# Patient Record
Sex: Female | Born: 1990 | Hispanic: No | Marital: Single | Smoking: Former smoker
Health system: Southern US, Community
[De-identification: ages and names within clinical notes are randomized; demographics above are authoritative.]

## PROBLEM LIST (undated history)

## (undated) ENCOUNTER — Inpatient Hospital Stay (HOSPITAL_COMMUNITY): Payer: Self-pay

## (undated) DIAGNOSIS — F329 Major depressive disorder, single episode, unspecified: Secondary | ICD-10-CM

## (undated) DIAGNOSIS — F909 Attention-deficit hyperactivity disorder, unspecified type: Secondary | ICD-10-CM

## (undated) DIAGNOSIS — O24419 Gestational diabetes mellitus in pregnancy, unspecified control: Secondary | ICD-10-CM

## (undated) DIAGNOSIS — F913 Oppositional defiant disorder: Secondary | ICD-10-CM

## (undated) DIAGNOSIS — F99 Mental disorder, not otherwise specified: Secondary | ICD-10-CM

## (undated) DIAGNOSIS — F419 Anxiety disorder, unspecified: Secondary | ICD-10-CM

## (undated) DIAGNOSIS — Z98891 History of uterine scar from previous surgery: Secondary | ICD-10-CM

## (undated) HISTORY — PX: MOUTH SURGERY: SHX715

---
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
## 2011-02-14 LAB — DIFFERENTIAL
Basophils Absolute: 0 10*3/uL (ref 0.0–0.1)
Basophils Relative: 0 % (ref 0–1)
Eosinophils Absolute: 0.2 10*3/uL (ref 0.0–1.2)
Eosinophils Relative: 2 % (ref 0–5)
Lymphocytes Relative: 37 % (ref 24–48)
Lymphs Abs: 3.3 10*3/uL (ref 1.1–4.8)
Monocytes Absolute: 0.6 10*3/uL (ref 0.2–1.2)
Monocytes Relative: 7 % (ref 3–11)
Neutro Abs: 4.7 10*3/uL (ref 1.7–8.0)
Neutrophils Relative %: 54 % (ref 43–71)

## 2011-02-14 LAB — COMPREHENSIVE METABOLIC PANEL
ALT: 10 U/L (ref 0–35)
AST: 14 U/L (ref 0–37)
Albumin: 3.2 g/dL — ABNORMAL LOW (ref 3.5–5.2)
Alkaline Phosphatase: 50 U/L (ref 47–119)
BUN: 10 mg/dL (ref 6–23)
CO2: 24 mEq/L (ref 19–32)
Calcium: 9 mg/dL (ref 8.4–10.5)
Chloride: 107 mEq/L (ref 96–112)
Creatinine, Ser: 0.83 mg/dL (ref 0.4–1.2)
Glucose, Bld: 121 mg/dL — ABNORMAL HIGH (ref 70–99)
Potassium: 3.4 mEq/L — ABNORMAL LOW (ref 3.5–5.1)
Sodium: 140 mEq/L (ref 135–145)
Total Bilirubin: 0.5 mg/dL (ref 0.3–1.2)
Total Protein: 6.2 g/dL (ref 6.0–8.3)

## 2011-02-14 LAB — RAPID URINE DRUG SCREEN, HOSP PERFORMED
Amphetamines: NOT DETECTED
Barbiturates: NOT DETECTED
Benzodiazepines: NOT DETECTED
Cocaine: NOT DETECTED
Opiates: NOT DETECTED
Tetrahydrocannabinol: NOT DETECTED

## 2011-02-14 LAB — ACETAMINOPHEN LEVEL: Acetaminophen (Tylenol), Serum: <10 ug/mL — ABNORMAL LOW (ref 10–30)

## 2011-02-14 LAB — CBC
HCT: 37.3 % (ref 36.0–49.0)
Hemoglobin: 13 g/dL (ref 12.0–16.0)
MCHC: 34.8 g/dL (ref 31.0–37.0)
MCV: 86.4 fL (ref 78.0–98.0)
Platelets: 253 10*3/uL (ref 150–400)
RBC: 4.32 MIL/uL (ref 3.80–5.70)
RDW: 12.9 % (ref 11.4–15.5)
WBC: 8.8 10*3/uL (ref 4.5–13.5)

## 2011-02-14 LAB — SALICYLATE LEVEL: Salicylate Lvl: <4 mg/dL (ref 2.8–20.0)

## 2011-02-14 LAB — PREGNANCY, URINE: Preg Test, Ur: NEGATIVE

## 2011-07-09 ENCOUNTER — Encounter (HOSPITAL_COMMUNITY): Payer: Self-pay | Admitting: *Deleted

## 2011-07-09 ENCOUNTER — Inpatient Hospital Stay (HOSPITAL_COMMUNITY)
Admission: AD | Admit: 2011-07-09 | Discharge: 2011-07-09 | Disposition: A | Discharge status: Home / Self Care | Payer: Self-pay | Source: Ambulatory Visit | Attending: Obstetrics & Gynecology | Admitting: Obstetrics & Gynecology

## 2011-07-09 DIAGNOSIS — O200 Threatened abortion: Secondary | ICD-10-CM | POA: Insufficient documentation

## 2011-07-09 LAB — URINALYSIS, ROUTINE W REFLEX MICROSCOPIC
Glucose, UA: NEGATIVE mg/dL
Protein, ur: NEGATIVE mg/dL

## 2011-07-09 LAB — URINE MICROSCOPIC-ADD ON

## 2011-07-09 LAB — POCT PREGNANCY, URINE: Preg Test, Ur: POSITIVE

## 2011-07-09 NOTE — ED Provider Notes (Signed)
History     Chief Complaint  Patient presents with  . Possible Pregnancy   HPI Patient seen for intermittent suprapubic cramping x 1 week.  Had minimal brownish spotting this morning.  No radiation of cramping.  Has some mild nausea, no vomiting.  No fevers, chills, dysuria, vaginal discharge, frequency.  OB History    Grav Para Term Preterm Abortions TAB SAB Ect Mult Living   1               Past Medical History  Diagnosis Date  . No pertinent past medical history     Past Surgical History  Procedure Date  . No past surgeries     No family history on file.  History  Substance Use Topics  . Smoking status: Former Games developer  . Smokeless tobacco: Not on file  . Alcohol Use: No    Allergies: Allergies not on file  No prescriptions prior to admission    Review of Systems  All other systems reviewed and are negative.   Physical Exam   Blood pressure 133/88, pulse 80, temperature 98.8 F (37.1 C), temperature source Oral, resp. rate 18, height 5\' 2"  (1.575 m), weight 76.204 kg (168 lb), last menstrual period 05/01/2011, SpO2 98.00%.  Physical Exam  Constitutional: She appears well-developed and well-nourished.  HENT:  Head: Normocephalic and atraumatic.  Eyes: EOM are normal. Pupils are equal, round, and reactive to light.  Neck: Normal range of motion.  Cardiovascular: Normal rate and regular rhythm.   Respiratory: Effort normal and breath sounds normal.  GI: Soft. Bowel sounds are normal. She exhibits no distension and no mass. There is no tenderness. There is no rebound and no guarding.    Bedside US for fetal heart rate showed IUP at HR at 160.  MAU Course  Procedures Bedside US for FHR.  Assessment and Plan  1.  Threatened abortion. Discussed with patient possibility of miscarriage.  Information given to patient.  Patient counseled to establish care or to return if cramping continues, spotting or vaginal bleeding increases.  Saima Monterroso  JEHIEL 07/09/2011, 2:36 PM

## 2011-07-09 NOTE — Progress Notes (Signed)
Pt states she had a POS HPT last week, Has had cramping on and off and a little dark spotting. Nausea, no vomiting.

## 2011-07-22 ENCOUNTER — Inpatient Hospital Stay (HOSPITAL_COMMUNITY)
Admission: AD | Admit: 2011-07-22 | Discharge: 2011-07-22 | Disposition: A | Discharge status: Home / Self Care | Payer: Medicaid Other | Source: Ambulatory Visit | Attending: Obstetrics and Gynecology | Admitting: Obstetrics and Gynecology

## 2011-07-22 ENCOUNTER — Encounter (HOSPITAL_COMMUNITY): Payer: Self-pay

## 2011-07-22 DIAGNOSIS — Z3400 Encounter for supervision of normal first pregnancy, unspecified trimester: Secondary | ICD-10-CM

## 2011-07-22 DIAGNOSIS — O99891 Other specified diseases and conditions complicating pregnancy: Secondary | ICD-10-CM | POA: Insufficient documentation

## 2011-07-22 HISTORY — DX: Attention-deficit hyperactivity disorder, unspecified type: F90.9

## 2011-07-22 HISTORY — DX: Mental disorder, not otherwise specified: F99
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
## 2011-07-22 NOTE — Progress Notes (Signed)
Pt was taking Risperdal and stopped taking when she found out she was pregnant.

## 2011-07-27 LAB — ABO/RH: RH Type: POSITIVE

## 2011-07-27 LAB — HEPATITIS B SURFACE ANTIGEN: Hepatitis B Surface Ag: NEGATIVE

## 2011-07-27 LAB — RUBELLA ANTIBODY, IGM: Rubella: IMMUNE

## 2011-07-27 LAB — RPR: RPR: NONREACTIVE

## 2011-11-10 ENCOUNTER — Encounter (HOSPITAL_COMMUNITY): Payer: Self-pay | Admitting: *Deleted

## 2011-11-10 ENCOUNTER — Inpatient Hospital Stay (HOSPITAL_COMMUNITY)
Admission: AD | Admit: 2011-11-10 | Discharge: 2011-11-10 | Disposition: A | Discharge status: Home / Self Care | Payer: Medicaid Other | Source: Ambulatory Visit | Attending: Obstetrics and Gynecology | Admitting: Obstetrics and Gynecology

## 2011-11-10 DIAGNOSIS — O212 Late vomiting of pregnancy: Secondary | ICD-10-CM | POA: Insufficient documentation

## 2011-11-10 DIAGNOSIS — Z3480 Encounter for supervision of other normal pregnancy, unspecified trimester: Secondary | ICD-10-CM

## 2011-11-10 DIAGNOSIS — R197 Diarrhea, unspecified: Secondary | ICD-10-CM | POA: Insufficient documentation

## 2011-11-10 DIAGNOSIS — R112 Nausea with vomiting, unspecified: Secondary | ICD-10-CM

## 2011-11-10 LAB — URINALYSIS, ROUTINE W REFLEX MICROSCOPIC
Hgb urine dipstick: NEGATIVE
Ketones, ur: 40 mg/dL — AB
Protein, ur: 30 mg/dL — AB
Urobilinogen, UA: 0.2 mg/dL (ref 0.0–1.0)

## 2011-11-10 LAB — URINE MICROSCOPIC-ADD ON

## 2011-11-10 MED ORDER — LOPERAMIDE HCL 2 MG PO CAPS
4.0000 mg | ORAL_CAPSULE | ORAL | Status: DC | PRN
  Administered 2011-11-10: 4 mg via ORAL
  Filled 2011-11-10: qty 2

## 2011-11-10 MED ORDER — NITROFURANTOIN MONOHYD MACRO 100 MG PO CAPS: 100.0000 mg | ORAL_CAPSULE | Freq: Two times a day (BID) | ORAL | Status: AC

## 2011-11-10 MED ORDER — ONDANSETRON 8 MG/NS 50 ML IVPB
8.0000 mg | Freq: Once | INTRAVENOUS | Status: AC
  Administered 2011-11-10: 8 mg via INTRAVENOUS
  Filled 2011-11-10: qty 8

## 2011-11-10 MED ORDER — LACTATED RINGERS IV BOLUS (SEPSIS)
1000.0000 mL | Freq: Once | INTRAVENOUS | Status: AC
  Administered 2011-11-10: 1000 mL via INTRAVENOUS

## 2011-11-10 MED ORDER — ONDANSETRON 4 MG PO TBDP
4.0000 mg | ORAL_TABLET | Freq: Once | ORAL | Status: AC
  Administered 2011-11-10: 4 mg via ORAL
  Filled 2011-11-10: qty 1

## 2011-11-10 MED ORDER — ONDANSETRON 4 MG PO TBDP: 4.0000 mg | ORAL_TABLET | Freq: Once | ORAL | Status: AC

## 2011-11-10 NOTE — Progress Notes (Signed)
Pt reports  Waking up throwing up many times with watery diarreaha. tp reports eating at [employer] last night about 3 am. Boy friend states he feels a little nausead as well but has not thrown up

## 2011-11-10 NOTE — ED Provider Notes (Signed)
Pt feels better since IV zofran and IV fluids.  She denies any fever or dysuria.  U/A + nitrites.  - CVAT.  Will d/c pt home with rx for zofran and macrobid and instructions to seek care if she develops a fever or back pain.  FHR 140's, reactive without decels or contractions

## 2011-11-10 NOTE — ED Provider Notes (Signed)
History     Chief Complaint  Patient presents with  . Emesis  . Diarrhea   HPITabitha Silva is 21 y.o. G1P0 [redacted]w[redacted]d weeks presenting with nausea, vomiting and diarrhea that began this am.  Sudden onset.  Denies fever.  Patient of Dr. Berenda Morale.  Denies vaginal bleeding, contraction.  Reports fetal movement today.      Past Medical History  Diagnosis Date  . Mental disorder   . ADHD (attention deficit hyperactivity disorder)     Past Surgical History  Procedure Date  . No past surgeries     No family history on file.  History  Substance Use Topics  . Smoking status: Current Everyday Smoker  . Smokeless tobacco: Never Used  . Alcohol Use: No    Allergies:  Allergies  Allergen Reactions  . Sulfa Antibiotics Hives    Prescriptions prior to admission  Medication Sig Dispense Refill  . flintstones complete (FLINTSTONES) 60 MG chewable tablet Chew 1 tablet by mouth daily.          Review of Systems  Constitutional: Negative for fever and chills.  Respiratory: Positive for shortness of breath.   Gastrointestinal: Positive for nausea, vomiting and diarrhea. Negative for abdominal pain.  Genitourinary:       Neg for vaginal bleeding.   Physical Exam   Blood pressure 131/78, pulse 119, temperature 98.4 F (36.9 C), temperature source Oral, resp. rate 18, height 5\' 3"  (1.6 m), weight 180 lb 6.4 oz (81.829 kg), last menstrual period 05/01/2011.  Physical Exam  Constitutional: She is oriented to person, place, and time. She appears well-developed and well-nourished.  HENT:  Head: Normocephalic.  Neck: Normal range of motion.  Respiratory: Effort normal and breath sounds normal. No respiratory distress. She has no wheezes. She has no rales. She exhibits no tenderness.  GI: There is no tenderness. There is no rebound and no guarding.  Neurological: She is alert and oriented to person, place, and time.  Skin: Skin is warm and dry.    MAU Course  Procedures  MDM   Care turned over to Olivia Silva, CNM  Assessment and Plan    KEY,EVE M 11/10/2011, 11:06 AM   Matt Holmes, NP 11/10/11 1158

## 2011-11-10 NOTE — Progress Notes (Signed)
Reviewed labs with Philipp Deputy, CNM.  Plans to put in orders for medications and IV fluids

## 2011-11-11 NOTE — Progress Notes (Signed)
FHT from 1-5 reviewed.  Reactive NST, especially for 27 weeks, no significant ctx.

## 2012-02-02 ENCOUNTER — Inpatient Hospital Stay (HOSPITAL_COMMUNITY)
Admission: AD | Admit: 2012-02-02 | Discharge: 2012-02-03 | Disposition: A | Discharge status: Home / Self Care | Payer: Medicaid Other | Source: Ambulatory Visit | Attending: Obstetrics and Gynecology | Admitting: Obstetrics and Gynecology

## 2012-02-02 ENCOUNTER — Encounter (HOSPITAL_COMMUNITY): Payer: Self-pay | Admitting: Obstetrics and Gynecology

## 2012-02-02 DIAGNOSIS — O479 False labor, unspecified: Secondary | ICD-10-CM | POA: Insufficient documentation

## 2012-02-02 HISTORY — DX: Oppositional defiant disorder: F91.3

## 2012-02-02 HISTORY — DX: Major depressive disorder, single episode, unspecified: F32.9

## 2012-02-02 NOTE — MAU Note (Signed)
"  I started having UC's about 2000 tonight.  I leaked some water 2 days after finishing a shower and continued to leak out water.  I was seen in the doctor's office on Thursday and they did not do a pH test.  I am still wetting my underwear several times a day.  (+) FM, just not that often."

## 2012-02-02 NOTE — Progress Notes (Signed)
"  In 2008, I attempted to commit suicide and was hospitalized for it.  Since then, I have been diagnosed and put on the correct medications for it."

## 2012-02-03 LAB — AMNISURE RUPTURE OF MEMBRANE (ROM) NOT AT ARMC: Amnisure ROM: NEGATIVE

## 2012-02-03 NOTE — Discharge Instructions (Signed)
Keep your scheduled appointment with [facility] this week.  Please call your doctor's office for any further questions or concerns.

## 2012-02-04 ENCOUNTER — Encounter (HOSPITAL_COMMUNITY)
Admission: AD | Disposition: A | Discharge status: Home / Self Care | Payer: Self-pay | Source: Home / Self Care | Attending: Obstetrics and Gynecology

## 2012-02-04 ENCOUNTER — Inpatient Hospital Stay (HOSPITAL_COMMUNITY): Payer: Medicaid Other | Admitting: Anesthesiology

## 2012-02-04 ENCOUNTER — Encounter (HOSPITAL_COMMUNITY): Payer: Self-pay | Admitting: *Deleted

## 2012-02-04 ENCOUNTER — Encounter (HOSPITAL_COMMUNITY): Payer: Self-pay | Admitting: Anesthesiology

## 2012-02-04 ENCOUNTER — Inpatient Hospital Stay (HOSPITAL_COMMUNITY)
Admission: AD | Admit: 2012-02-04 | Discharge: 2012-02-06 | DRG: 766 | Disposition: A | Discharge status: Home / Self Care | Payer: Medicaid Other | Attending: Obstetrics and Gynecology | Admitting: Obstetrics and Gynecology

## 2012-02-04 ENCOUNTER — Other Ambulatory Visit: Payer: Self-pay

## 2012-02-04 DIAGNOSIS — O99892 Other specified diseases and conditions complicating childbirth: Secondary | ICD-10-CM | POA: Diagnosis present

## 2012-02-04 DIAGNOSIS — Z22330 Carrier of Group B streptococcus: Secondary | ICD-10-CM

## 2012-02-04 DIAGNOSIS — O4290 Premature rupture of membranes, unspecified as to length of time between rupture and onset of labor, unspecified weeks of gestation: Principal | ICD-10-CM | POA: Diagnosis present

## 2012-02-04 DIAGNOSIS — Z98891 History of uterine scar from previous surgery: Secondary | ICD-10-CM

## 2012-02-04 HISTORY — DX: History of uterine scar from previous surgery: Z98.891

## 2012-02-04 LAB — CBC
HCT: 33.7 % — ABNORMAL LOW (ref 36.0–46.0)
MCHC: 32.9 g/dL (ref 30.0–36.0)
MCV: 90.3 fL (ref 78.0–100.0)
Platelets: 186 10*3/uL (ref 150–400)
RDW: 14 % (ref 11.5–15.5)

## 2012-02-04 SURGERY — Surgical Case
Anesthesia: Epidural | Site: Abdomen | Wound class: Clean Contaminated

## 2012-02-04 MED ORDER — LACTATED RINGERS IV SOLN
INTRAVENOUS | Status: DC | PRN
  Administered 2012-02-04 (×2): via INTRAVENOUS

## 2012-02-04 MED ORDER — MENTHOL 3 MG MT LOZG: 1.0000 | LOZENGE | OROMUCOSAL | Status: DC | PRN

## 2012-02-04 MED ORDER — SCOPOLAMINE 1 MG/3DAYS TD PT72
1.0000 | MEDICATED_PATCH | Freq: Once | TRANSDERMAL | Status: DC
  Administered 2012-02-04: 1.5 mg via TRANSDERMAL

## 2012-02-04 MED ORDER — PENICILLIN G POTASSIUM 5000000 UNITS IJ SOLR
5.0000 10*6.[IU] | Freq: Once | INTRAVENOUS | Status: AC
  Administered 2012-02-04: 5 10*6.[IU] via INTRAVENOUS
  Filled 2012-02-04: qty 5

## 2012-02-04 MED ORDER — MEPERIDINE HCL 25 MG/ML IJ SOLN: 6.2500 mg | INTRAMUSCULAR | Status: DC | PRN

## 2012-02-04 MED ORDER — PRENATAL MULTIVITAMIN CH
1.0000 | ORAL_TABLET | Freq: Every day | ORAL | Status: DC
  Administered 2012-02-05 – 2012-02-06 (×2): 1 via ORAL
  Filled 2012-02-04 (×2): qty 1

## 2012-02-04 MED ORDER — TETANUS-DIPHTH-ACELL PERTUSSIS 5-2.5-18.5 LF-MCG/0.5 IM SUSP: 0.5000 mL | Freq: Once | INTRAMUSCULAR | Status: DC

## 2012-02-04 MED ORDER — FENTANYL CITRATE 0.05 MG/ML IJ SOLN
INTRAMUSCULAR | Status: AC
  Filled 2012-02-04: qty 2

## 2012-02-04 MED ORDER — WITCH HAZEL-GLYCERIN EX PADS: 1.0000 "application " | MEDICATED_PAD | CUTANEOUS | Status: DC | PRN

## 2012-02-04 MED ORDER — FLEET ENEMA 7-19 GM/118ML RE ENEM: 1.0000 | ENEMA | RECTAL | Status: DC | PRN

## 2012-02-04 MED ORDER — PHENYLEPHRINE 40 MCG/ML (10ML) SYRINGE FOR IV PUSH (FOR BLOOD PRESSURE SUPPORT)
80.0000 ug | PREFILLED_SYRINGE | INTRAVENOUS | Status: DC | PRN
  Filled 2012-02-04: qty 5

## 2012-02-04 MED ORDER — 0.9 % SODIUM CHLORIDE (POUR BTL) OPTIME
TOPICAL | Status: DC | PRN
  Administered 2012-02-04: 1000 mL

## 2012-02-04 MED ORDER — OXYTOCIN 10 UNIT/ML IJ SOLN
INTRAMUSCULAR | Status: DC | PRN
  Administered 2012-02-04: 20 [IU]

## 2012-02-04 MED ORDER — BUPIVACAINE HCL (PF) 0.25 % IJ SOLN: INTRAMUSCULAR | Status: DC | PRN

## 2012-02-04 MED ORDER — ZOLPIDEM TARTRATE 5 MG PO TABS: 5.0000 mg | ORAL_TABLET | Freq: Every evening | ORAL | Status: DC | PRN

## 2012-02-04 MED ORDER — MORPHINE SULFATE (PF) 0.5 MG/ML IJ SOLN
INTRAMUSCULAR | Status: DC | PRN
  Administered 2012-02-04: 1 mg via INTRAVENOUS
  Administered 2012-02-04: 4 mg via EPIDURAL

## 2012-02-04 MED ORDER — SIMETHICONE 80 MG PO CHEW
80.0000 mg | CHEWABLE_TABLET | Freq: Three times a day (TID) | ORAL | Status: DC
  Administered 2012-02-05 – 2012-02-06 (×5): 80 mg via ORAL

## 2012-02-04 MED ORDER — TERBUTALINE SULFATE 1 MG/ML IJ SOLN: 0.2500 mg | Freq: Once | INTRAMUSCULAR | Status: DC | PRN

## 2012-02-04 MED ORDER — LIDOCAINE HCL (PF) 1 % IJ SOLN: 30.0000 mL | INTRAMUSCULAR | Status: DC | PRN

## 2012-02-04 MED ORDER — LACTATED RINGERS IV SOLN
500.0000 mL | INTRAVENOUS | Status: DC | PRN
  Administered 2012-02-04: 1000 mL via INTRAVENOUS

## 2012-02-04 MED ORDER — LACTATED RINGERS IV SOLN
INTRAVENOUS | Status: DC
  Administered 2012-02-05 (×2): via INTRAVENOUS

## 2012-02-04 MED ORDER — OXYCODONE-ACETAMINOPHEN 5-325 MG PO TABS
1.0000 | ORAL_TABLET | ORAL | Status: DC | PRN
  Administered 2012-02-05: 2 via ORAL
  Administered 2012-02-05 (×2): 1 via ORAL
  Administered 2012-02-05 – 2012-02-06 (×2): 2 via ORAL
  Filled 2012-02-04 (×2): qty 2
  Filled 2012-02-04: qty 1
  Filled 2012-02-04: qty 2
  Filled 2012-02-04: qty 1

## 2012-02-04 MED ORDER — LACTATED RINGERS IV SOLN
INTRAVENOUS | Status: DC
  Administered 2012-02-04 (×3): via INTRAVENOUS

## 2012-02-04 MED ORDER — SCOPOLAMINE 1 MG/3DAYS TD PT72
MEDICATED_PATCH | TRANSDERMAL | Status: AC
  Filled 2012-02-04: qty 1

## 2012-02-04 MED ORDER — LANOLIN HYDROUS EX OINT: 1.0000 "application " | TOPICAL_OINTMENT | CUTANEOUS | Status: DC | PRN

## 2012-02-04 MED ORDER — ONDANSETRON HCL 4 MG/2ML IJ SOLN
INTRAMUSCULAR | Status: DC | PRN
  Administered 2012-02-04: 4 mg via INTRAVENOUS

## 2012-02-04 MED ORDER — ONDANSETRON HCL 4 MG/2ML IJ SOLN: 4.0000 mg | INTRAMUSCULAR | Status: DC | PRN

## 2012-02-04 MED ORDER — PHENYLEPHRINE 40 MCG/ML (10ML) SYRINGE FOR IV PUSH (FOR BLOOD PRESSURE SUPPORT): 80.0000 ug | PREFILLED_SYRINGE | INTRAVENOUS | Status: DC | PRN

## 2012-02-04 MED ORDER — IBUPROFEN 600 MG PO TABS: 600.0000 mg | ORAL_TABLET | Freq: Four times a day (QID) | ORAL | Status: DC | PRN

## 2012-02-04 MED ORDER — KETOROLAC TROMETHAMINE 30 MG/ML IJ SOLN: 30.0000 mg | Freq: Four times a day (QID) | INTRAMUSCULAR | Status: AC | PRN

## 2012-02-04 MED ORDER — CEFAZOLIN SODIUM-DEXTROSE 2-3 GM-% IV SOLR
2.0000 g | Freq: Three times a day (TID) | INTRAVENOUS | Status: DC
  Administered 2012-02-04: 2 g via INTRAVENOUS
  Filled 2012-02-04 (×2): qty 50

## 2012-02-04 MED ORDER — KETOROLAC TROMETHAMINE 60 MG/2ML IM SOLN
INTRAMUSCULAR | Status: AC
  Filled 2012-02-04: qty 2

## 2012-02-04 MED ORDER — SODIUM BICARBONATE 8.4 % IV SOLN
INTRAVENOUS | Status: DC | PRN
  Administered 2012-02-04: 4 mL via EPIDURAL

## 2012-02-04 MED ORDER — NALBUPHINE HCL 10 MG/ML IJ SOLN
5.0000 mg | INTRAMUSCULAR | Status: DC | PRN
  Filled 2012-02-04: qty 1

## 2012-02-04 MED ORDER — DIPHENHYDRAMINE HCL 25 MG PO CAPS: 25.0000 mg | ORAL_CAPSULE | ORAL | Status: DC | PRN

## 2012-02-04 MED ORDER — OXYTOCIN 20 UNITS IN LACTATED RINGERS INFUSION - SIMPLE
INTRAVENOUS | Status: AC
  Filled 2012-02-04: qty 1000

## 2012-02-04 MED ORDER — EPHEDRINE 5 MG/ML INJ
10.0000 mg | INTRAVENOUS | Status: DC | PRN
  Filled 2012-02-04: qty 4

## 2012-02-04 MED ORDER — DIPHENHYDRAMINE HCL 50 MG/ML IJ SOLN: 12.5000 mg | INTRAMUSCULAR | Status: DC | PRN

## 2012-02-04 MED ORDER — ACETAMINOPHEN 325 MG PO TABS: 650.0000 mg | ORAL_TABLET | ORAL | Status: DC | PRN

## 2012-02-04 MED ORDER — OXYTOCIN 20 UNITS IN LACTATED RINGERS INFUSION - SIMPLE: 125.0000 mL/h | Freq: Once | INTRAVENOUS | Status: DC

## 2012-02-04 MED ORDER — ONDANSETRON HCL 4 MG/2ML IJ SOLN: 4.0000 mg | Freq: Three times a day (TID) | INTRAMUSCULAR | Status: DC | PRN

## 2012-02-04 MED ORDER — EPHEDRINE 5 MG/ML INJ: 10.0000 mg | INTRAVENOUS | Status: DC | PRN

## 2012-02-04 MED ORDER — SIMETHICONE 80 MG PO CHEW: 80.0000 mg | CHEWABLE_TABLET | ORAL | Status: DC | PRN

## 2012-02-04 MED ORDER — DIPHENHYDRAMINE HCL 50 MG/ML IJ SOLN: 25.0000 mg | INTRAMUSCULAR | Status: DC | PRN

## 2012-02-04 MED ORDER — HYDROMORPHONE HCL PF 1 MG/ML IJ SOLN
0.2500 mg | INTRAMUSCULAR | Status: DC | PRN
  Administered 2012-02-04: 0.5 mg via INTRAVENOUS
  Administered 2012-02-04 (×2): 0.25 mg via INTRAVENOUS

## 2012-02-04 MED ORDER — OXYTOCIN 20 UNITS IN LACTATED RINGERS INFUSION - SIMPLE
1.0000 m[IU]/min | INTRAVENOUS | Status: DC
  Administered 2012-02-04: 1 m[IU]/min via INTRAVENOUS
  Filled 2012-02-04: qty 1000

## 2012-02-04 MED ORDER — LACTATED RINGERS IV SOLN
500.0000 mL | Freq: Once | INTRAVENOUS | Status: AC
  Administered 2012-02-04: 09:00:00 via INTRAVENOUS

## 2012-02-04 MED ORDER — MORPHINE SULFATE 0.5 MG/ML IJ SOLN
INTRAMUSCULAR | Status: AC
  Filled 2012-02-04: qty 10

## 2012-02-04 MED ORDER — CITRIC ACID-SODIUM CITRATE 334-500 MG/5ML PO SOLN
30.0000 mL | ORAL | Status: DC | PRN
  Administered 2012-02-04: 30 mL via ORAL
  Filled 2012-02-04: qty 15

## 2012-02-04 MED ORDER — LIDOCAINE-EPINEPHRINE 2 %-1:100000 IJ SOLN: INTRAMUSCULAR | Status: DC | PRN

## 2012-02-04 MED ORDER — SODIUM BICARBONATE 8.4 % IV SOLN
INTRAVENOUS | Status: AC
  Filled 2012-02-04: qty 50

## 2012-02-04 MED ORDER — IBUPROFEN 600 MG PO TABS
600.0000 mg | ORAL_TABLET | Freq: Four times a day (QID) | ORAL | Status: DC
  Administered 2012-02-05 – 2012-02-06 (×4): 600 mg via ORAL
  Filled 2012-02-04 (×4): qty 1

## 2012-02-04 MED ORDER — KETOROLAC TROMETHAMINE 30 MG/ML IJ SOLN
30.0000 mg | Freq: Four times a day (QID) | INTRAMUSCULAR | Status: AC | PRN
  Administered 2012-02-05: 30 mg via INTRAVENOUS
  Filled 2012-02-04: qty 1

## 2012-02-04 MED ORDER — ONDANSETRON HCL 4 MG PO TABS: 4.0000 mg | ORAL_TABLET | ORAL | Status: DC | PRN

## 2012-02-04 MED ORDER — DIPHENHYDRAMINE HCL 25 MG PO CAPS: 25.0000 mg | ORAL_CAPSULE | Freq: Four times a day (QID) | ORAL | Status: DC | PRN

## 2012-02-04 MED ORDER — FENTANYL 2.5 MCG/ML BUPIVACAINE 1/10 % EPIDURAL INFUSION (WH - ANES)
14.0000 mL/h | INTRAMUSCULAR | Status: DC
  Administered 2012-02-04 (×2): 14 mL/h via EPIDURAL
  Filled 2012-02-04 (×3): qty 60

## 2012-02-04 MED ORDER — OXYTOCIN 10 UNIT/ML IJ SOLN
INTRAMUSCULAR | Status: AC
  Filled 2012-02-04: qty 2

## 2012-02-04 MED ORDER — ONDANSETRON HCL 4 MG/2ML IJ SOLN
INTRAMUSCULAR | Status: AC
  Filled 2012-02-04: qty 2

## 2012-02-04 MED ORDER — METOCLOPRAMIDE HCL 5 MG/ML IJ SOLN: 10.0000 mg | Freq: Three times a day (TID) | INTRAMUSCULAR | Status: DC | PRN

## 2012-02-04 MED ORDER — PENICILLIN G POTASSIUM 5000000 UNITS IJ SOLR
2.5000 10*6.[IU] | INTRAVENOUS | Status: DC
  Administered 2012-02-04 (×2): 2.5 10*6.[IU] via INTRAVENOUS
  Filled 2012-02-04 (×6): qty 2.5

## 2012-02-04 MED ORDER — ONDANSETRON HCL 4 MG/2ML IJ SOLN: 4.0000 mg | Freq: Four times a day (QID) | INTRAMUSCULAR | Status: DC | PRN

## 2012-02-04 MED ORDER — DIBUCAINE 1 % RE OINT: 1.0000 "application " | TOPICAL_OINTMENT | RECTAL | Status: DC | PRN

## 2012-02-04 MED ORDER — OXYCODONE-ACETAMINOPHEN 5-325 MG PO TABS: 1.0000 | ORAL_TABLET | ORAL | Status: DC | PRN

## 2012-02-04 MED ORDER — SODIUM CHLORIDE 0.9 % IV SOLN
1.0000 ug/kg/h | INTRAVENOUS | Status: DC | PRN
  Filled 2012-02-04: qty 2.5

## 2012-02-04 MED ORDER — MEPERIDINE HCL 25 MG/ML IJ SOLN
INTRAMUSCULAR | Status: DC | PRN
  Administered 2012-02-04: 12.5 mg via INTRAVENOUS

## 2012-02-04 MED ORDER — HYDROMORPHONE HCL PF 1 MG/ML IJ SOLN
INTRAMUSCULAR | Status: AC
  Filled 2012-02-04: qty 1

## 2012-02-04 MED ORDER — KETOROLAC TROMETHAMINE 60 MG/2ML IM SOLN
60.0000 mg | Freq: Once | INTRAMUSCULAR | Status: AC | PRN
  Administered 2012-02-04: 60 mg via INTRAMUSCULAR

## 2012-02-04 MED ORDER — SODIUM CHLORIDE 0.9 % IJ SOLN: 3.0000 mL | INTRAMUSCULAR | Status: DC | PRN

## 2012-02-04 MED ORDER — OXYTOCIN BOLUS FROM INFUSION
500.0000 mL | Freq: Once | INTRAVENOUS | Status: DC
  Filled 2012-02-04: qty 500

## 2012-02-04 MED ORDER — SENNOSIDES-DOCUSATE SODIUM 8.6-50 MG PO TABS
2.0000 | ORAL_TABLET | Freq: Every day | ORAL | Status: DC
  Administered 2012-02-05: 2 via ORAL

## 2012-02-04 MED ORDER — FENTANYL 2.5 MCG/ML BUPIVACAINE 1/10 % EPIDURAL INFUSION (WH - ANES)
INTRAMUSCULAR | Status: DC | PRN
  Administered 2012-02-04: 14 mL/h via EPIDURAL

## 2012-02-04 MED ORDER — LIDOCAINE-EPINEPHRINE (PF) 2 %-1:200000 IJ SOLN
INTRAMUSCULAR | Status: AC
  Filled 2012-02-04: qty 20

## 2012-02-04 MED ORDER — OXYTOCIN 20 UNITS IN LACTATED RINGERS INFUSION - SIMPLE: 125.0000 mL/h | INTRAVENOUS | Status: AC

## 2012-02-04 MED ORDER — NALOXONE HCL 0.4 MG/ML IJ SOLN: 0.4000 mg | INTRAMUSCULAR | Status: DC | PRN

## 2012-02-04 SURGICAL SUPPLY — 35 items
APL SKNCLS STERI-STRIP NONHPOA (GAUZE/BANDAGES/DRESSINGS) ×1
BENZOIN TINCTURE PRP APPL 2/3 (GAUZE/BANDAGES/DRESSINGS) ×1 IMPLANT
CHLORAPREP W/TINT 26ML (MISCELLANEOUS) ×2 IMPLANT
CLOTH BEACON ORANGE TIMEOUT ST (SAFETY) ×2 IMPLANT
CONTAINER PREFILL 10% NBF 15ML (MISCELLANEOUS) IMPLANT
DRSG COVADERM 4X10 (GAUZE/BANDAGES/DRESSINGS) IMPLANT
ELECT REM PT RETURN 9FT ADLT (ELECTROSURGICAL) ×2
ELECTRODE REM PT RTRN 9FT ADLT (ELECTROSURGICAL) ×1 IMPLANT
EXTRACTOR VACUUM KIWI (MISCELLANEOUS) ×1 IMPLANT
EXTRACTOR VACUUM M CUP 4 TUBE (SUCTIONS) ×1 IMPLANT
GLOVE BIO SURGEON STRL SZ 6.5 (GLOVE) ×4 IMPLANT
GLOVE BIO SURGEON STRL SZ8 (GLOVE) ×1 IMPLANT
GOWN PREVENTION PLUS LG XLONG (DISPOSABLE) ×4 IMPLANT
KIT ABG SYR 3ML LUER SLIP (SYRINGE) IMPLANT
NDL HYPO 25X5/8 SAFETYGLIDE (NEEDLE) ×1 IMPLANT
NEEDLE HYPO 25X5/8 SAFETYGLIDE (NEEDLE) IMPLANT
NS IRRIG 1000ML POUR BTL (IV SOLUTION) ×2 IMPLANT
PACK C SECTION WH (CUSTOM PROCEDURE TRAY) ×2 IMPLANT
RTRCTR C-SECT PINK 25CM LRG (MISCELLANEOUS) ×2 IMPLANT
SLEEVE SCD COMPRESS KNEE MED (MISCELLANEOUS) ×1 IMPLANT
STAPLER VISISTAT 35W (STAPLE) IMPLANT
STRIP CLOSURE SKIN 1/2X4 (GAUZE/BANDAGES/DRESSINGS) ×2 IMPLANT
SUT CHROMIC 1 CTX 36 (SUTURE) ×4 IMPLANT
SUT PLAIN 0 NONE (SUTURE) IMPLANT
SUT PLAIN 2 0 (SUTURE) ×2
SUT PLAIN 2 0 XLH (SUTURE) IMPLANT
SUT PLAIN ABS 2-0 CT1 27XMFL (SUTURE) IMPLANT
SUT VIC AB 0 CT1 27 (SUTURE) ×4
SUT VIC AB 0 CT1 27XBRD ANBCTR (SUTURE) ×2 IMPLANT
SUT VIC AB 2-0 CT1 27 (SUTURE) ×2
SUT VIC AB 2-0 CT1 TAPERPNT 27 (SUTURE) IMPLANT
SUT VIC AB 4-0 KS 27 (SUTURE) ×1 IMPLANT
TOWEL OR 17X24 6PK STRL BLUE (TOWEL DISPOSABLE) ×4 IMPLANT
TRAY FOLEY CATH 14FR (SET/KITS/TRAYS/PACK) ×1 IMPLANT
WATER STERILE IRR 1000ML POUR (IV SOLUTION) ×2 IMPLANT

## 2012-02-04 NOTE — Brief Op Note (Signed)
02/04/2012  7:21 PM  PATIENT:  Olivia Silva  21 y.o. female  PRE-OPERATIVE DIAGNOSIS:  Term pregnancy at 39+ weeks                                                       Arrest of dilation  POST-OPERATIVE DIAGNOSIS: same  PROCEDURE:  Procedure(s) (LRB): Primary Low transverse CESAREAN SECTION (N/A) with two layer closure of uterus  SURGEON:  Surgeon(s) and Role:    * Oliver Pila, MD - Primary   ANESTHESIA:   epidural  EBL:  Total I/O In: 500 [I.V.:500] Out: -   BLOOD ADMINISTERED:none  DRAINS: Urinary Catheter (Foley)   SPECIMEN:  Placenta  DISPOSITION OF SPECIMEN:  L&D  COUNTS:  YES   DICTATION: .Dragon Dictation  PLAN OF CARE: Admit to inpatient   PATIENT DISPOSITION:  PACU - hemodynamically stable.
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
## 2012-02-04 NOTE — Progress Notes (Signed)
Patient ID: Olivia Silva, female   DOB: September 20, 1991, 21 y.o.   MRN: [id number] Pt made good progress until reaching about 8+ cm at 230pm.  Since that time dspite adequate ctx, she has really only dilated to about 9cm with no progress in the last hour. FHR remains reassuring.  We attempted to push with reducing the cervix to see if any advancement of station could be made, but vtx remains at a 0/-1 with no progress despite good effort, and cervix does not fully reduce to complete. D/w pt options of continuing to labor vs proceeding with C-section.  We discussed risks and benefits of c-section including bleeding infection and possible damage to bowel and bladder.  She desires to proceed with c-section and forego any further labor.

## 2012-02-04 NOTE — MAU Note (Signed)
Pt presents with c/o of SROM since 0005

## 2012-02-04 NOTE — Anesthesia Preprocedure Evaluation (Signed)

## 2012-02-04 NOTE — Progress Notes (Signed)
  Subjective: Pt comfortable with epidural  Objective: BP 125/73  Pulse 86  Temp(Src) 98.9 F (37.2 C) (Oral)  Resp 24  Ht 5\' 3"  (1.6 m)  Wt 95.255 kg (210 lb)  BMI 37.20 kg/m2  SpO2 99%  LMP 05/01/2011      FHT:  FHR: 135 bpm, variability: moderate,  accelerations:  Present,  decelerations:  Absent UC:   regular, every 3 minutes SVE:   6/100/0 Pt making progress, IUPC placed to see if pitocin needs to be adjusted  Labs: Lab Results  Component Value Date   WBC 10.1 02/04/2012   HGB 11.1* 02/04/2012   HCT 33.7* 02/04/2012   MCV 90.3 02/04/2012   PLT 186 02/04/2012    Assessment / Plan: Pt now in active phase labor. Will follow progress and adjust pitocin as needed with IUPC readings.   Joni Fears 02/04/2012, 12:54 PM

## 2012-02-04 NOTE — Op Note (Signed)
Operative report  Preoperative diagnosis Term pregnancy at 39+ weeks Arrest of dilation at 9 cm  Postoperative diagnosis Same with direct OP presentation  Procedure Primary low transverse C-section with 2 layer closure of uterus  Surgeon Dr. Huel Cote  Anesthesia Epidural  Findings A viable female infant in the vertex, direct OP presentation. Apgars were 9 and 10. Weight pending at the time of dictation. Normal uterus tubes and ovaries were noted  Fluids Estimated blood loss 700 cc Urine output 150 cc of blood-tinged urine which was clearing by the end of the case IV fluids 2200 cc LR  Specimen Placenta was sent to L&D  Procedure note After informed consent was obtained from the patient she was taken to the operating room where epidural anesthesia was found to be adequate by Allis clamp test. An appropriate time out was performed and she was then prepped and draped in the normal sterile fashion in the dorsal supine position with a leftward tilt. A Pfannenstiel skin incision was then made and carried through to the underlying layer of fascia by sharp dissection and Bovie cautery. The fascia was nicked in the midline with Bovie and the incision extended laterally with Mayo scissors. The inferior aspect of the incision was grasped with Coker clamps elevated and dissected off the underlying rectus muscles. In a similar fashion the superior aspect was dissected off the rectus muscles. These were separated in the midline and the peritoneal cavity entered bluntly. The peritoneal incision was then extended both superiorly and inferiorly with careful attention to avoid both bowel bladder. The Alexis self-retaining wound retractor was then placed within the incision and the lower uterine segment was exposed nicely. This was then incised in a transverse fashion and the cavity itself entered bluntly. The infant was noted to be direct OP and the vertex was easily lifted to the incision. With  fundal pressure the vertex could not quite be delivered so the Kiwi vacuum was applied and gentle traction used to guide the vertex. Unfortunately good suction could not be obtained therefore this was changed to the soft cup female vacuum. The Alexis retractor was removed and the incision enlarged slightly. With the bell vacuum applied the vertex was then easily delivered. Nose and mouth were bulb suctioned and the infant was delivered with the cord clamped and cut and handed off to the waiting pediatricians with a vigorous cry. Apgars as stated were 9 and 10 and the weight was pending at the time of dictation. Cord blood was then obtained and handed off for donation and the placenta was expressed spontaneously. The uterus was cleared of all clots and debris with moist lap sponge. The incision was then inspected and a slight extension noted on the lower left angle. These were grasped and ring forceps and both the incision and the extension were able to be closed with 1-0 chromic in a running locked suture. A second layer of the same suture was then placed in an imbricating fashion. At this point good hemostasis was observed gutters were cleared of all clots and debris in the tubes and ovaries inspected and found to be normal. The Alexis retractor was removed and the bladder palpated with a Foley bulb felt intact and below the incision line. All instruments and sponges were then removed from the abdomen and the peritoneum and rectus muscles were reapproximated with 2-0 Vicryl. The fascia was then closed in 0 Vicryl in a running fashion. The subcutaneous tissue was reapproximated with 2 plain in a running  fashion. The skin was closed with subcuticular stitch of 3-0 Vicryl on a Keith needle. Steri-Strips and benzoin were applied for reinforcement and instrument counts were again correct. The patient was then taken to the recovery room in good condition with her baby present.

## 2012-02-04 NOTE — Transfer of Care (Signed)
Immediate Anesthesia Transfer of Care Note  Patient: Olivia Silva  Procedure(s) Performed: Procedure(s) (LRB): CESAREAN SECTION (N/A)  Patient Location: PACU  Anesthesia Type: Epidural  Level of Consciousness: awake, alert  and oriented  Airway & Oxygen Therapy: Patient Spontanous Breathing  Post-op Assessment: Report given to PACU RN  Post vital signs: Reviewed and stable  Complications: No apparent anesthesia complications

## 2012-02-04 NOTE — Progress Notes (Signed)
   Subjective: Pt getting increasingly uncomfortable, requesting epidural  Objective: BP 109/60  Pulse 91  Temp(Src) 98.1 F (36.7 C) (Oral)  Resp 16  Ht 5\' 3"  (1.6 m)  Wt 95.255 kg (210 lb)  BMI 37.20 kg/m2  SpO2 99%  LMP 05/01/2011      FHT:  FHR: 140 bpm, variability: moderate,  accelerations:  Present,  decelerations:  Absent UC:   regular, every 3-4 minutes SVE: 80/3-4/-1  AROM of forebag--clear    Labs: Lab Results  Component Value Date   WBC 10.1 02/04/2012   HGB 11.1* 02/04/2012   HCT 33.7* 02/04/2012   MCV 90.3 02/04/2012   PLT 186 02/04/2012    Assessment / Plan: Augmentation of labor, progressing well Pt on PCN for +GBS, will get epidural.  Oliver Pila 02/04/2012, 8:49 AM

## 2012-02-04 NOTE — Anesthesia Postprocedure Evaluation (Signed)
Anesthesia Post Note  Patient: Olivia Silva  Procedure(s) Performed: Procedure(s) (LRB): CESAREAN SECTION (N/A)  Anesthesia type: Epidural  Patient location: PACU  Post pain: Pain level controlled  Post assessment: Post-op Vital signs reviewed  Last Vitals:  Filed Vitals:   02/04/12 1910  BP: 128/80  Pulse: 110  Temp: 36.9 C  Resp: 20    Post vital signs: Reviewed  Level of consciousness: awake  Complications: No apparent anesthesia complications

## 2012-02-04 NOTE — Anesthesia Procedure Notes (Signed)

## 2012-02-05 ENCOUNTER — Encounter (HOSPITAL_COMMUNITY): Payer: Self-pay | Admitting: Obstetrics and Gynecology

## 2012-02-05 LAB — CBC
Hemoglobin: 8.4 g/dL — ABNORMAL LOW (ref 12.0–15.0)
MCH: 29.6 pg (ref 26.0–34.0)
Platelets: 120 10*3/uL — ABNORMAL LOW (ref 150–400)
RBC: 2.84 MIL/uL — ABNORMAL LOW (ref 3.87–5.11)

## 2012-02-05 MED ORDER — ONDANSETRON HCL 4 MG/2ML IJ SOLN
INTRAMUSCULAR | Status: AC
  Filled 2012-02-05: qty 2

## 2012-02-05 MED ORDER — LIDOCAINE-EPINEPHRINE (PF) 2 %-1:200000 IJ SOLN
INTRAMUSCULAR | Status: AC
  Filled 2012-02-05: qty 20

## 2012-02-05 MED ORDER — OXYTOCIN 10 UNIT/ML IJ SOLN
INTRAMUSCULAR | Status: AC
  Filled 2012-02-05: qty 2

## 2012-02-05 MED ORDER — SODIUM BICARBONATE 8.4 % IV SOLN
INTRAVENOUS | Status: AC
  Filled 2012-02-05: qty 50

## 2012-02-05 NOTE — Anesthesia Postprocedure Evaluation (Signed)
  Anesthesia Post-op Note  Patient: Olivia Silva  Procedure(s) Performed: Procedure(s) (LRB): CESAREAN SECTION (N/A)  Patient Location: Mother/Baby  Anesthesia Type: Epidural  Level of Consciousness: awake  Airway and Oxygen Therapy: Patient Spontanous Breathing  Post-op Pain: none  Post-op Assessment: Patient's Cardiovascular Status Stable, Respiratory Function Stable, Patent Airway, No signs of Nausea or vomiting, Adequate PO intake, Pain level controlled, No headache, No backache, No residual numbness and No residual motor weakness  Post-op Vital Signs: Reviewed and stable  Complications: No apparent anesthesia complications

## 2012-02-05 NOTE — Progress Notes (Signed)
UR chart review completed.  

## 2012-02-05 NOTE — Progress Notes (Signed)
Subjective: Postpartum Day 1: Cesarean Delivery Patient reports tolerating PO.  No dizziness.  Foley catheter still in   Objective: Vital signs in last 24 hours: Temp:  [98.1 F (36.7 C)-99.6 F (37.6 C)] 98.2 F (36.8 C) (04/02 0655) Pulse Rate:  [75-127] 93  (04/02 0655) Resp:  [14-24] 18  (04/02 0655) BP: (111-167)/(56-147) 117/69 mmHg (04/02 0655) SpO2:  [96 %-100 %] 98 % (04/02 0655)  Physical Exam:  General: alert Lochia: appropriate Uterine Fundus: firm Incision: C/D/I    Basename 02/05/12 0505 02/04/12 0530  HGB 8.4* 11.1*  HCT 25.8* 33.7*    Assessment/Plan: Status post Cesarean section. Doing well postoperatively.  Continue current care.  Olivia Silva 02/05/2012, 9:23 AM

## 2012-02-05 NOTE — Addendum Note (Signed)
Addendum  created 02/05/12 0816 by Suella Grove, CRNA   Modules edited:Notes Section

## 2012-02-06 ENCOUNTER — Encounter (HOSPITAL_COMMUNITY): Payer: Self-pay | Admitting: Obstetrics and Gynecology

## 2012-02-06 DIAGNOSIS — Z98891 History of uterine scar from previous surgery: Secondary | ICD-10-CM

## 2012-02-06 HISTORY — DX: History of uterine scar from previous surgery: Z98.891

## 2012-02-06 MED ORDER — PRENATAL MULTIVITAMIN CH: 1.0000 | ORAL_TABLET | Freq: Every day | ORAL | Status: AC

## 2012-02-06 MED ORDER — IBUPROFEN 800 MG PO TABS: 800.0000 mg | ORAL_TABLET | Freq: Four times a day (QID) | ORAL | Status: AC

## 2012-02-06 MED ORDER — OXYCODONE-ACETAMINOPHEN 5-325 MG PO TABS
1.0000 | ORAL_TABLET | ORAL | Status: AC | PRN
Start: 2012-02-06 — End: 2012-02-16

## 2012-02-06 NOTE — Progress Notes (Signed)
Clinical Social Work Department  BRIEF PSYCHOSOCIAL ASSESSMENT  02/06/2012  Patient: Olivia Silva,Olivia Silva Account Number: [id number] Admit date: 02/04/2012  Clinical Social Worker: Jamylah Marinaccio, LCSWA Date/Time: 02/06/2012 10:30 AM  Referred by: Physician Date Referred: 02/06/2012  Referred for   Behavioral Health Issues   Other Referral:  Interview type: Patient  Other interview type:  PSYCHOSOCIAL DATA  Living Status: PARENTS  Admitted from facility:  Level of care:  Primary support name: Olivia Silva  Primary support relationship to patient: FRIEND  Degree of support available:  Involved   CURRENT CONCERNS  Current Concerns   Behavioral Health Issues   Other Concerns:  SOCIAL WORK ASSESSMENT / PLAN  Pt acknowledges diagnoses of ADHD and bipolar disorder, stating she was diagnosed at age 15. Pt's symptoms were being managed by a physician in [place], [place] & treated with Concerta and Risperdal, until pregnancy confirmation. Pt states she was able to cope well without the medication and would like to stay off the medication if possible. PP depression symptoms discussed briefly. Pt agrees to seek mental health treatment if symptoms arise. Pt relocated to the area with her family approximately 1 year ago. She lives with her mother, brother, sister and 2 nieces . According to pt she has a good support system. FOB is at the bedside and supportive. She denies any recent depression or SI. Pt requested assistance is scheduling a follow up appointment for the infant. CN staff aware and will schedule an appointment at [facility]. Sw observed pt bonding well with the infant. Sw available to assist further if needed.   Assessment/plan status: No Further Intervention Required  Other assessment/ plan:  Information/referral to community resources:  PATIENT'S/FAMILY'S RESPONSE TO PLAN OF CARE:  Pt and FOB thanked Sw for consult and assistance with infants follow up appointment.     

## 2012-02-06 NOTE — Discharge Summary (Signed)
Obstetric Discharge Summary Reason for Admission: rupture of membranes Prenatal Procedures: none Intrapartum Procedures: cesarean: low cervical, transverse Postpartum Procedures: none Complications-Operative and Postpartum: none Hemoglobin  Date Value Range Status  02/05/2012 8.4* 12.0-15.0 (g/dL) Final     REPEATED TO VERIFY     DELTA CHECK NOTED     HCT  Date Value Range Status  02/05/2012 25.8* 36.0-46.0 (%) Final    Physical Exam:  General: alert and no distress Lochia: appropriate Uterine Fundus: firm Incision: healing well DVT Evaluation: No evidence of DVT seen on physical exam.  Discharge Diagnoses: Term Pregnancy-delivered  Discharge Information: Date: 02/06/2012 Activity: pelvic rest Diet: routine Medications: PNV, Ibuprofen and Percocet Condition: stable Instructions: refer to practice specific booklet Discharge to: home Follow-up Information    Follow up with Oliver Pila, MD. Schedule an appointment as soon as possible for a visit in 2 weeks.   Contact information:   [address] [place] [address] [phone]          Newborn Data: Live born female  Birth Weight: 9 lb 1.7 oz (4130 g) APGAR: 9, 10  Home with mother.  BOVARD,Audrielle Vankuren 02/06/2012, 8:44 AM

## 2012-02-06 NOTE — Progress Notes (Signed)
Subjective: Postpartum Day 2: Cesarean Delivery Patient reports incisional pain, tolerating PO and no problems voiding.  Pain controlled, nl lochia  Objective: Vital signs in last 24 hours: Temp:  [98.3 F (36.8 C)-98.7 F (37.1 C)] 98.7 F (37.1 C) (04/03 0501) Pulse Rate:  [77-95] 93  (04/03 0501) Resp:  [16-18] 18  (04/03 0501) BP: (115-128)/(64-79) 125/76 mmHg (04/03 0501) SpO2:  [95 %-98 %] 95 % (04/02 1800)  Physical Exam:  General: alert and no distress Lochia: appropriate Uterine Fundus: firm Incision: healing well DVT Evaluation: No evidence of DVT seen on physical exam.   Basename 02/05/12 0505 02/04/12 0530  HGB 8.4* 11.1*  HCT 25.8* 33.7*    Assessment/Plan: Status post Cesarean section. Doing well postoperatively.  Discharge home with standard precautions and return to clinic in 2 weeks.  D/C with Motrin, PNV, Percocet.  D/c at pt's request  BOVARD,Ineta Sinning 02/06/2012, 8:38 AM

## 2012-02-11 ENCOUNTER — Inpatient Hospital Stay (HOSPITAL_COMMUNITY): Admission: RE | Admit: 2012-02-11 | Payer: Medicaid Other | Source: Ambulatory Visit

## 2013-04-19 ENCOUNTER — Emergency Department: Payer: Self-pay | Admitting: Emergency Medicine

## 2014-09-06 ENCOUNTER — Encounter (HOSPITAL_COMMUNITY): Payer: Self-pay | Admitting: Obstetrics and Gynecology

## 2017-06-28 ENCOUNTER — Emergency Department (HOSPITAL_COMMUNITY)
Admission: EM | Admit: 2017-06-28 | Discharge: 2017-06-28 | Disposition: A | Discharge status: Home / Self Care | Payer: Medicaid Other | Attending: Emergency Medicine | Admitting: Emergency Medicine

## 2017-06-28 ENCOUNTER — Encounter (HOSPITAL_COMMUNITY): Payer: Self-pay | Admitting: Emergency Medicine

## 2017-06-28 DIAGNOSIS — S0502XA Injury of conjunctiva and corneal abrasion without foreign body, left eye, initial encounter: Secondary | ICD-10-CM | POA: Diagnosis not present

## 2017-06-28 DIAGNOSIS — F909 Attention-deficit hyperactivity disorder, unspecified type: Secondary | ICD-10-CM | POA: Diagnosis not present

## 2017-06-28 DIAGNOSIS — Y999 Unspecified external cause status: Secondary | ICD-10-CM | POA: Insufficient documentation

## 2017-06-28 DIAGNOSIS — Y92410 Unspecified street and highway as the place of occurrence of the external cause: Secondary | ICD-10-CM | POA: Diagnosis not present

## 2017-06-28 DIAGNOSIS — F17210 Nicotine dependence, cigarettes, uncomplicated: Secondary | ICD-10-CM | POA: Insufficient documentation

## 2017-06-28 DIAGNOSIS — S0083XA Contusion of other part of head, initial encounter: Secondary | ICD-10-CM

## 2017-06-28 DIAGNOSIS — Y9389 Activity, other specified: Secondary | ICD-10-CM | POA: Insufficient documentation

## 2017-06-28 DIAGNOSIS — S0993XA Unspecified injury of face, initial encounter: Secondary | ICD-10-CM | POA: Diagnosis present

## 2017-06-28 MED ORDER — FLUORESCEIN SODIUM 1 MG OP STRP
ORAL_STRIP | OPHTHALMIC | Status: AC
  Administered 2017-06-28: 1
  Filled 2017-06-28: qty 1

## 2017-06-28 MED ORDER — IBUPROFEN 800 MG PO TABS
800.0000 mg | ORAL_TABLET | Freq: Once | ORAL | Status: AC
  Administered 2017-06-28: 800 mg via ORAL
  Filled 2017-06-28: qty 1

## 2017-06-28 MED ORDER — TOBRAMYCIN 0.3 % OP SOLN
1.0000 [drp] | Freq: Four times a day (QID) | OPHTHALMIC | Status: DC
  Administered 2017-06-28: 1 [drp] via OPHTHALMIC
  Filled 2017-06-28: qty 5

## 2017-06-28 NOTE — ED Notes (Signed)
Bed: WA02 Expected date:  Expected time:  Means of arrival:  Comments: 
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
## 2017-06-28 NOTE — ED Notes (Signed)
Pt ambulatory and independent at discharge.  Verbalized understanding of discharge instructions 
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
## 2018-03-11 LAB — OB RESULTS CONSOLE HEPATITIS B SURFACE ANTIGEN: Hepatitis B Surface Ag: NEGATIVE

## 2018-03-11 LAB — OB RESULTS CONSOLE RUBELLA ANTIBODY, IGM: RUBELLA: IMMUNE

## 2018-03-11 LAB — OB RESULTS CONSOLE GC/CHLAMYDIA
Chlamydia: NEGATIVE
GC PROBE AMP, GENITAL: NEGATIVE

## 2018-03-11 LAB — OB RESULTS CONSOLE ABO/RH: RH TYPE: POSITIVE

## 2018-03-11 LAB — OB RESULTS CONSOLE RPR: RPR: NONREACTIVE

## 2018-03-11 LAB — OB RESULTS CONSOLE HIV ANTIBODY (ROUTINE TESTING): HIV: NONREACTIVE

## 2018-03-11 LAB — OB RESULTS CONSOLE ANTIBODY SCREEN: Antibody Screen: NEGATIVE

## 2018-08-01 ENCOUNTER — Encounter: Payer: Self-pay | Admitting: Obstetrics and Gynecology

## 2018-08-13 ENCOUNTER — Ambulatory Visit: Payer: Self-pay | Admitting: Registered"

## 2018-08-20 ENCOUNTER — Encounter: Payer: Medicaid Other | Attending: Obstetrics and Gynecology | Admitting: Registered"

## 2018-08-20 DIAGNOSIS — Z713 Dietary counseling and surveillance: Secondary | ICD-10-CM | POA: Insufficient documentation

## 2018-08-20 DIAGNOSIS — O99810 Abnormal glucose complicating pregnancy: Secondary | ICD-10-CM

## 2018-08-20 DIAGNOSIS — R7309 Other abnormal glucose: Secondary | ICD-10-CM | POA: Insufficient documentation

## 2018-08-26 ENCOUNTER — Encounter: Payer: Self-pay | Admitting: Registered"

## 2018-08-26 DIAGNOSIS — O99810 Abnormal glucose complicating pregnancy: Secondary | ICD-10-CM | POA: Insufficient documentation

## 2018-08-26 NOTE — Progress Notes (Signed)
Patient was seen on 08/20/18 for Gestational Diabetes self-management class at the [facility]. The following learning objectives were met by the patient during this course:   States the definition of Gestational Diabetes  States why dietary management is important in controlling blood glucose  Describes the effects each nutrient has on blood glucose levels  Demonstrates ability to create a balanced meal plan  Demonstrates carbohydrate counting   States when to check blood glucose levels  Demonstrates proper blood glucose monitoring techniques  States the effect of stress and exercise on blood glucose levels  States the importance of limiting caffeine and abstaining from alcohol and smoking  Blood glucose monitor given: Accu-Chek Guide me Lot # [id number] Exp: 09/23/19 Blood glucose reading: 89 mg/dL  Patient instructed to monitor glucose levels: FBS: 60 - <95; 1 hour: <140; 2 hour: <120  Patient received handouts:  Nutrition Diabetes and Pregnancy, including carb counting list  Patient will be seen for follow-up as needed.

## 2018-09-08 ENCOUNTER — Telehealth (HOSPITAL_COMMUNITY): Payer: Self-pay | Admitting: *Deleted

## 2018-09-08 ENCOUNTER — Encounter (HOSPITAL_COMMUNITY): Payer: Self-pay | Admitting: *Deleted

## 2018-09-08 NOTE — Telephone Encounter (Signed)
Preadmission screen  

## 2018-09-09 ENCOUNTER — Telehealth (HOSPITAL_COMMUNITY): Payer: Self-pay | Admitting: *Deleted

## 2018-09-09 NOTE — Telephone Encounter (Signed)
Preadmission screen  

## 2018-09-10 ENCOUNTER — Encounter (HOSPITAL_COMMUNITY): Payer: Self-pay

## 2018-09-22 ENCOUNTER — Encounter (HOSPITAL_COMMUNITY)
Admission: RE | Admit: 2018-09-22 | Discharge: 2018-09-22 | Disposition: A | Discharge status: Home / Self Care | Payer: Medicaid Other | Source: Ambulatory Visit | Attending: Obstetrics and Gynecology | Admitting: Obstetrics and Gynecology

## 2018-09-22 DIAGNOSIS — O34219 Maternal care for unspecified type scar from previous cesarean delivery: Secondary | ICD-10-CM | POA: Insufficient documentation

## 2018-09-22 DIAGNOSIS — Z833 Family history of diabetes mellitus: Secondary | ICD-10-CM

## 2018-09-22 DIAGNOSIS — Z01812 Encounter for preprocedural laboratory examination: Secondary | ICD-10-CM | POA: Insufficient documentation

## 2018-09-22 DIAGNOSIS — Z8249 Family history of ischemic heart disease and other diseases of the circulatory system: Secondary | ICD-10-CM

## 2018-09-22 DIAGNOSIS — Z87891 Personal history of nicotine dependence: Secondary | ICD-10-CM

## 2018-09-22 DIAGNOSIS — Z3A39 39 weeks gestation of pregnancy: Secondary | ICD-10-CM

## 2018-09-22 DIAGNOSIS — O24415 Gestational diabetes mellitus in pregnancy, controlled by oral hypoglycemic drugs: Secondary | ICD-10-CM | POA: Insufficient documentation

## 2018-09-22 HISTORY — DX: Anxiety disorder, unspecified: F41.9

## 2018-09-22 HISTORY — DX: Gestational diabetes mellitus in pregnancy, unspecified control: O24.419

## 2018-09-22 LAB — BASIC METABOLIC PANEL
ANION GAP: 11 (ref 5–15)
BUN: 11 mg/dL (ref 6–20)
CALCIUM: 8.7 mg/dL — AB (ref 8.9–10.3)
CO2: 19 mmol/L — ABNORMAL LOW (ref 22–32)
Chloride: 104 mmol/L (ref 98–111)
Creatinine, Ser: 0.71 mg/dL (ref 0.44–1.00)
Glucose, Bld: 91 mg/dL (ref 70–99)
Potassium: 3.9 mmol/L (ref 3.5–5.1)
Sodium: 134 mmol/L — ABNORMAL LOW (ref 135–145)

## 2018-09-22 LAB — CBC
HCT: 35.7 % — ABNORMAL LOW (ref 36.0–46.0)
Hemoglobin: 11.5 g/dL — ABNORMAL LOW (ref 12.0–15.0)
MCH: 29 pg (ref 26.0–34.0)
MCHC: 32.2 g/dL (ref 30.0–36.0)
MCV: 89.9 fL (ref 80.0–100.0)
NRBC: 0 % (ref 0.0–0.2)
PLATELETS: 200 10*3/uL (ref 150–400)
RBC: 3.97 MIL/uL (ref 3.87–5.11)
RDW: 13.8 % (ref 11.5–15.5)
WBC: 8.2 10*3/uL (ref 4.0–10.5)

## 2018-09-22 LAB — ABO/RH: ABO/RH(D): A POS

## 2018-09-22 LAB — TYPE AND SCREEN
ABO/RH(D): A POS
Antibody Screen: NEGATIVE

## 2018-09-22 NOTE — H&P (Signed)
Olivia Silva is a 27 y.o. female G 2 P1001 at 639 0/7 weeks (EDD 09/30/18 by LMP c/w 10 week US)   presenting for scheduled repeat c-section with prior c-section for arrest of labor and a macrosomic infant 9+#.  This pregnancy significant for gestational diabetes fairly well-controlled on metformin 1000mg  po q hs and 500mg  po q AM.  She also has a h/o anxiety but has been stable off meds.  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  OB History    Gravida  2   Para  1   Term  1   Preterm      AB      Living  1     SAB      TAB      Ectopic      Multiple      Live Births  1         2013 LTCS [id number]   Past Medical History:  Diagnosis Date  . ADHD (attention deficit hyperactivity disorder)   . Anxiety   . Gestational diabetes   . Major depressive disorder   . Mental disorder   . ODD (oppositional defiant disorder)   . S/P cesarean section 02/06/2012   Past Surgical History:  Procedure Laterality Date  . CESAREAN SECTION  02/04/2012   Procedure: CESAREAN SECTION;  Surgeon: Oliver PilaKathy W Sani Madariaga, MD;  Location: [facility];  Service: Gynecology;  Laterality: N/A;  Primary Cesarean Section Delivery Boy @ 1818, Apgars 9/10   . MOUTH SURGERY     Family History: family history includes Cancer in her father and mother; Diabetes in her father and mother; Heart attack in her maternal grandfather; Hypertension in her father and mother. Social History:  reports that she quit smoking about 7 months ago. Her smoking use included cigarettes. She has never used smokeless tobacco. She reports that she does not drink alcohol or use drugs.     Maternal Diabetes: Yes:  Diabetes Type:  Insulin/Medication controlled Genetic Screening: Normal Maternal Ultrasounds/Referrals: Normal Fetal Ultrasounds or other Referrals:  None Maternal Substance Abuse:  No Significant Maternal Medications:  Meds include: Other: metformin Significant Maternal Lab Results:  Lab values include: Group B Strep positive Other Comments:   None  Review of Systems  Constitutional: Negative for fever.  Gastrointestinal: Negative for abdominal pain.  Neurological: Negative for headaches.   Maternal Medical History:  Contractions: Frequency: rare.   Perceived severity is mild.    Fetal activity: Perceived fetal activity is normal.    Prenatal Complications - Diabetes: gestational. Diabetes is managed by oral agent (monotherapy).        Last menstrual period 12/24/2017, unknown if currently breastfeeding. Maternal Exam:  Uterine Assessment: Contraction strength is mild.  Contraction frequency is rare.   Abdomen: Patient reports no abdominal tenderness. Surgical scars: low transverse.   Fetal presentation: vertex  Introitus: Normal vulva. Normal vagina.    Physical Exam  Constitutional: She appears well-developed and well-nourished.  Cardiovascular: Normal rate and regular rhythm.  Respiratory: Effort normal.  GI: Soft.  Genitourinary: Vagina normal.  Genitourinary Comments: Fundal height 42cm  Neurological: She is alert.  Psychiatric: She has a normal mood and affect.    Prenatal labs: ABO, Rh: --/--/A POS, A POS Performed at [facility], [address] [place], [place] [address]  [id number](11/18 0925) Antibody: NEG (11/18 [id number]) Rubella: Immune (05/07 0000) RPR: Nonreactive (05/07 0000)  HBsAg: Negative (05/07 0000)  HIV: Non-reactive (05/07 0000)  GBS:   Positive One hour GCT  170 Three hour GTT abnormal First trimester screen and AFP negative Essential panel negative  Assessment/Plan:  d/w pt c-section risks and benefits in detail including bleeding, infection and risk of damage to surrounding organs.  Pt agreeable to proceed. She would accept a blood transfusion if needed.   Oliver Pila 09/22/2018, 8:22 PM

## 2018-09-22 NOTE — Anesthesia Preprocedure Evaluation (Signed)
Anesthesia Evaluation  Patient identified by MRN, date of birth, ID band Patient awake    Reviewed: Allergy & Precautions, H&P , NPO status , Patient's Chart, lab work & pertinent test results, reviewed documented beta blocker date and time   Airway Mallampati: II  TM Distance: >3 FB Neck ROM: full    Dental no notable dental hx. (+) Teeth Intact   Pulmonary neg pulmonary ROS, former smoker,    Pulmonary exam normal breath sounds clear to auscultation       Cardiovascular negative cardio ROS Normal cardiovascular exam Rhythm:regular Rate:Normal     Neuro/Psych PSYCHIATRIC DISORDERS Anxiety Depression negative neurological ROS     GI/Hepatic negative GI ROS, Neg liver ROS,   Endo/Other  negative endocrine ROSdiabetes, Gestational  Renal/GU negative Renal ROS  negative genitourinary   Musculoskeletal   Abdominal (+) + obese,   Peds  Hematology  (+) Blood dyscrasia, anemia ,   Anesthesia Other Findings       Reproductive/Obstetrics (+) Pregnancy                             Anesthesia Physical  Anesthesia Plan  ASA: III  Anesthesia Plan: Spinal   Post-op Pain Management:    Induction:   PONV Risk Score and Plan: 2  Airway Management Planned: Nasal Cannula and Natural Airway  Additional Equipment:   Intra-op Plan:   Post-operative Plan:   Informed Consent: I have reviewed the patients History and Physical, chart, labs and discussed the procedure including the risks, benefits and alternatives for the proposed anesthesia with the patient or authorized representative who has indicated his/her understanding and acceptance.   Dental Advisory Given  Plan Discussed with: CRNA, Anesthesiologist and Surgeon  Anesthesia Plan Comments: (Labs checked- platelets confirmed with RN in room. Fetal heart tracing, per RN, reported to be stable enough for sitting procedure. Discussed  epidural, and patient consents to the procedure:  included risk of possible headache,backache, failed block, allergic reaction, and nerve injury. This patient was asked if she had any questions or concerns before the procedure started. )        Anesthesia Quick Evaluation

## 2018-09-22 NOTE — Patient Instructions (Signed)
Olivia Silva  09/22/2018   Your procedure is scheduled on:  09/23/2018  Enter through the Main Entr[facility] of [facility] at 0530 AM.  Pick up the phone at the desk and dial [phone]  Call this number if you have problems the morning of surgery:[phone]  Remember:   Do not eat food:(After Midnight) Desps de medianoche.  Do not drink clear liquids: (After Midnight) Desps de medianoche.  Take these medicines the morning of surgery with A SIP OF WATER: DO NOT TAKE METFORMIN THE NIGHT BEFORE OR THE MORNING OF SURGERY   Do not wear jewelry, make-up or nail polish.  Do not wear lotions, powders, or perfumes. Do not wear deodorant.  Do not shave 48 hours prior to surgery.  Do not bring valuables to the hospital.  [facility] is not   responsible for any belongings or valuables brought to the hospital.  Contacts, dentures or bridgework may not be worn into surgery.  Leave suitcase in the car. After surgery it may be brought to your room.  For patients admitted to the hospital, checkout time is 11:00 AM the day of              discharge.    N/A   Please read over the following fact sheets that you were given:   Surgical Site Infection Prevention

## 2018-09-23 ENCOUNTER — Inpatient Hospital Stay (HOSPITAL_COMMUNITY)
Admission: RE | Admit: 2018-09-23 | Discharge: 2018-09-26 | DRG: 788 | Disposition: A | Discharge status: Home / Self Care | Payer: Medicaid Other | Attending: Obstetrics and Gynecology | Admitting: Obstetrics and Gynecology

## 2018-09-23 ENCOUNTER — Encounter (HOSPITAL_COMMUNITY): Payer: Self-pay | Admitting: *Deleted

## 2018-09-23 ENCOUNTER — Encounter (HOSPITAL_COMMUNITY)
Admission: RE | Disposition: A | Discharge status: Home / Self Care | Payer: Self-pay | Source: Home / Self Care | Attending: Obstetrics and Gynecology

## 2018-09-23 ENCOUNTER — Other Ambulatory Visit: Payer: Self-pay

## 2018-09-23 ENCOUNTER — Inpatient Hospital Stay (HOSPITAL_COMMUNITY): Payer: Medicaid Other | Admitting: Anesthesiology

## 2018-09-23 DIAGNOSIS — O24425 Gestational diabetes mellitus in childbirth, controlled by oral hypoglycemic drugs: Secondary | ICD-10-CM | POA: Diagnosis present

## 2018-09-23 DIAGNOSIS — O99824 Streptococcus B carrier state complicating childbirth: Secondary | ICD-10-CM | POA: Diagnosis present

## 2018-09-23 DIAGNOSIS — Z87891 Personal history of nicotine dependence: Secondary | ICD-10-CM | POA: Diagnosis not present

## 2018-09-23 DIAGNOSIS — Z98891 History of uterine scar from previous surgery: Secondary | ICD-10-CM

## 2018-09-23 DIAGNOSIS — D649 Anemia, unspecified: Secondary | ICD-10-CM | POA: Diagnosis present

## 2018-09-23 DIAGNOSIS — O34211 Maternal care for low transverse scar from previous cesarean delivery: Secondary | ICD-10-CM | POA: Diagnosis present

## 2018-09-23 DIAGNOSIS — O9902 Anemia complicating childbirth: Secondary | ICD-10-CM | POA: Diagnosis present

## 2018-09-23 DIAGNOSIS — Z3A39 39 weeks gestation of pregnancy: Secondary | ICD-10-CM

## 2018-09-23 LAB — GLUCOSE, CAPILLARY: Glucose-Capillary: 97 mg/dL (ref 70–99)

## 2018-09-23 LAB — RPR: RPR Ser Ql: NONREACTIVE

## 2018-09-23 SURGERY — Surgical Case
Anesthesia: Spinal

## 2018-09-23 MED ORDER — ACETAMINOPHEN 325 MG PO TABS: 325.0000 mg | ORAL_TABLET | ORAL | Status: DC | PRN

## 2018-09-23 MED ORDER — DIPHENHYDRAMINE HCL 50 MG/ML IJ SOLN: 12.5000 mg | INTRAMUSCULAR | Status: DC | PRN

## 2018-09-23 MED ORDER — SIMETHICONE 80 MG PO CHEW
80.0000 mg | CHEWABLE_TABLET | ORAL | Status: DC
  Administered 2018-09-23 – 2018-09-26 (×3): 80 mg via ORAL
  Filled 2018-09-23 (×3): qty 1

## 2018-09-23 MED ORDER — WITCH HAZEL-GLYCERIN EX PADS: 1.0000 "application " | MEDICATED_PAD | CUTANEOUS | Status: DC | PRN

## 2018-09-23 MED ORDER — BUPIVACAINE IN DEXTROSE 0.75-8.25 % IT SOLN
INTRATHECAL | Status: DC | PRN
  Administered 2018-09-23: 1.6 mL via INTRATHECAL

## 2018-09-23 MED ORDER — NALBUPHINE HCL 10 MG/ML IJ SOLN: 5.0000 mg | Freq: Once | INTRAMUSCULAR | Status: DC | PRN

## 2018-09-23 MED ORDER — CEFAZOLIN SODIUM-DEXTROSE 2-4 GM/100ML-% IV SOLN
2.0000 g | INTRAVENOUS | Status: AC
  Administered 2018-09-23: 2 g via INTRAVENOUS
  Filled 2018-09-23: qty 100

## 2018-09-23 MED ORDER — OXYTOCIN 10 UNIT/ML IJ SOLN
INTRAMUSCULAR | Status: AC
  Filled 2018-09-23: qty 4

## 2018-09-23 MED ORDER — KETOROLAC TROMETHAMINE 30 MG/ML IJ SOLN
30.0000 mg | Freq: Four times a day (QID) | INTRAMUSCULAR | Status: AC | PRN
  Administered 2018-09-23: 30 mg via INTRAMUSCULAR

## 2018-09-23 MED ORDER — COCONUT OIL OIL
1.0000 "application " | TOPICAL_OIL | Status: DC | PRN
  Filled 2018-09-23: qty 120

## 2018-09-23 MED ORDER — SODIUM CHLORIDE 0.9 % IR SOLN
Status: DC | PRN
  Administered 2018-09-23: 1000 mL

## 2018-09-23 MED ORDER — MEPERIDINE HCL 25 MG/ML IJ SOLN: 6.2500 mg | INTRAMUSCULAR | Status: DC | PRN

## 2018-09-23 MED ORDER — NALOXONE HCL 0.4 MG/ML IJ SOLN: 0.4000 mg | INTRAMUSCULAR | Status: DC | PRN

## 2018-09-23 MED ORDER — ACETAMINOPHEN 325 MG PO TABS
650.0000 mg | ORAL_TABLET | ORAL | Status: DC | PRN
  Administered 2018-09-23 – 2018-09-26 (×4): 650 mg via ORAL
  Filled 2018-09-23 (×4): qty 2

## 2018-09-23 MED ORDER — NALOXONE HCL 4 MG/10ML IJ SOLN
1.0000 ug/kg/h | INTRAVENOUS | Status: DC | PRN
  Filled 2018-09-23: qty 5

## 2018-09-23 MED ORDER — KETOROLAC TROMETHAMINE 30 MG/ML IJ SOLN: 30.0000 mg | Freq: Four times a day (QID) | INTRAMUSCULAR | Status: AC | PRN

## 2018-09-23 MED ORDER — FENTANYL CITRATE (PF) 100 MCG/2ML IJ SOLN
INTRAMUSCULAR | Status: AC
  Filled 2018-09-23: qty 2

## 2018-09-23 MED ORDER — OXYTOCIN 40 UNITS IN LACTATED RINGERS INFUSION - SIMPLE MED: 2.5000 [IU]/h | INTRAVENOUS | Status: AC

## 2018-09-23 MED ORDER — LACTATED RINGERS IV SOLN
INTRAVENOUS | Status: DC
  Administered 2018-09-23: 19:00:00 via INTRAVENOUS

## 2018-09-23 MED ORDER — PRENATAL MULTIVITAMIN CH
1.0000 | ORAL_TABLET | Freq: Every day | ORAL | Status: DC
  Administered 2018-09-24 – 2018-09-26 (×3): 1 via ORAL
  Filled 2018-09-23 (×3): qty 1

## 2018-09-23 MED ORDER — ACETAMINOPHEN 160 MG/5ML PO SOLN: 325.0000 mg | ORAL | Status: DC | PRN

## 2018-09-23 MED ORDER — ZOLPIDEM TARTRATE 5 MG PO TABS: 5.0000 mg | ORAL_TABLET | Freq: Every evening | ORAL | Status: DC | PRN

## 2018-09-23 MED ORDER — OXYCODONE HCL 5 MG PO TABS
5.0000 mg | ORAL_TABLET | ORAL | Status: DC | PRN
  Administered 2018-09-25 – 2018-09-26 (×5): 5 mg via ORAL
  Filled 2018-09-23 (×5): qty 1

## 2018-09-23 MED ORDER — NALBUPHINE HCL 10 MG/ML IJ SOLN: 5.0000 mg | INTRAMUSCULAR | Status: DC | PRN

## 2018-09-23 MED ORDER — OXYTOCIN 10 UNIT/ML IJ SOLN
INTRAVENOUS | Status: DC | PRN
  Administered 2018-09-23: 40 [IU] via INTRAVENOUS

## 2018-09-23 MED ORDER — ONDANSETRON HCL 4 MG/2ML IJ SOLN
INTRAMUSCULAR | Status: DC | PRN
  Administered 2018-09-23: 4 mg via INTRAVENOUS

## 2018-09-23 MED ORDER — FENTANYL CITRATE (PF) 100 MCG/2ML IJ SOLN
INTRAMUSCULAR | Status: DC | PRN
  Administered 2018-09-23: 15 ug via INTRATHECAL

## 2018-09-23 MED ORDER — PHENYLEPHRINE 8 MG IN D5W 100 ML (0.08MG/ML) PREMIX OPTIME
INJECTION | INTRAVENOUS | Status: AC
  Filled 2018-09-23: qty 100

## 2018-09-23 MED ORDER — DIPHENHYDRAMINE HCL 25 MG PO CAPS
25.0000 mg | ORAL_CAPSULE | ORAL | Status: DC | PRN
  Filled 2018-09-23: qty 1

## 2018-09-23 MED ORDER — OXYCODONE HCL 5 MG PO TABS: 5.0000 mg | ORAL_TABLET | Freq: Once | ORAL | Status: DC | PRN

## 2018-09-23 MED ORDER — STERILE WATER FOR IRRIGATION IR SOLN
Status: DC | PRN
  Administered 2018-09-23: 1000 mL

## 2018-09-23 MED ORDER — ONDANSETRON HCL 4 MG/2ML IJ SOLN: 4.0000 mg | Freq: Three times a day (TID) | INTRAMUSCULAR | Status: DC | PRN

## 2018-09-23 MED ORDER — SCOPOLAMINE 1 MG/3DAYS TD PT72
MEDICATED_PATCH | TRANSDERMAL | Status: AC
  Filled 2018-09-23: qty 1

## 2018-09-23 MED ORDER — SIMETHICONE 80 MG PO CHEW: 80.0000 mg | CHEWABLE_TABLET | ORAL | Status: DC | PRN

## 2018-09-23 MED ORDER — OXYCODONE HCL 5 MG PO TABS: 10.0000 mg | ORAL_TABLET | ORAL | Status: DC | PRN

## 2018-09-23 MED ORDER — MENTHOL 3 MG MT LOZG: 1.0000 | LOZENGE | OROMUCOSAL | Status: DC | PRN

## 2018-09-23 MED ORDER — PHENYLEPHRINE 8 MG IN D5W 100 ML (0.08MG/ML) PREMIX OPTIME
INJECTION | INTRAVENOUS | Status: DC | PRN
  Administered 2018-09-23: 60 ug/min via INTRAVENOUS

## 2018-09-23 MED ORDER — KETOROLAC TROMETHAMINE 30 MG/ML IJ SOLN
INTRAMUSCULAR | Status: AC
  Filled 2018-09-23: qty 1

## 2018-09-23 MED ORDER — LACTATED RINGERS IV SOLN
INTRAVENOUS | Status: DC | PRN
  Administered 2018-09-23: 08:00:00 via INTRAVENOUS

## 2018-09-23 MED ORDER — DIPHENHYDRAMINE HCL 25 MG PO CAPS: 25.0000 mg | ORAL_CAPSULE | Freq: Four times a day (QID) | ORAL | Status: DC | PRN

## 2018-09-23 MED ORDER — IBUPROFEN 600 MG PO TABS
600.0000 mg | ORAL_TABLET | Freq: Four times a day (QID) | ORAL | Status: DC
  Administered 2018-09-23 – 2018-09-26 (×12): 600 mg via ORAL
  Filled 2018-09-23 (×13): qty 1

## 2018-09-23 MED ORDER — SOD CITRATE-CITRIC ACID 500-334 MG/5ML PO SOLN
ORAL | Status: AC
  Administered 2018-09-23: 30 mL
  Filled 2018-09-23: qty 15

## 2018-09-23 MED ORDER — DIBUCAINE 1 % RE OINT: 1.0000 "application " | TOPICAL_OINTMENT | RECTAL | Status: DC | PRN

## 2018-09-23 MED ORDER — SIMETHICONE 80 MG PO CHEW
80.0000 mg | CHEWABLE_TABLET | Freq: Three times a day (TID) | ORAL | Status: DC
  Administered 2018-09-23 – 2018-09-26 (×8): 80 mg via ORAL
  Filled 2018-09-23 (×8): qty 1

## 2018-09-23 MED ORDER — MORPHINE SULFATE (PF) 0.5 MG/ML IJ SOLN
INTRAMUSCULAR | Status: DC | PRN
  Administered 2018-09-23: .15 mg via INTRATHECAL

## 2018-09-23 MED ORDER — ONDANSETRON HCL 4 MG/2ML IJ SOLN
INTRAMUSCULAR | Status: AC
  Filled 2018-09-23: qty 2

## 2018-09-23 MED ORDER — MORPHINE SULFATE (PF) 0.5 MG/ML IJ SOLN
INTRAMUSCULAR | Status: AC
  Filled 2018-09-23: qty 10

## 2018-09-23 MED ORDER — TETANUS-DIPHTH-ACELL PERTUSSIS 5-2.5-18.5 LF-MCG/0.5 IM SUSP: 0.5000 mL | Freq: Once | INTRAMUSCULAR | Status: DC

## 2018-09-23 MED ORDER — SCOPOLAMINE 1 MG/3DAYS TD PT72
1.0000 | MEDICATED_PATCH | Freq: Once | TRANSDERMAL | Status: AC
  Administered 2018-09-23: 1.5 mg via TRANSDERMAL

## 2018-09-23 MED ORDER — SENNOSIDES-DOCUSATE SODIUM 8.6-50 MG PO TABS
2.0000 | ORAL_TABLET | ORAL | Status: DC
  Administered 2018-09-23 – 2018-09-26 (×3): 2 via ORAL
  Filled 2018-09-23 (×3): qty 2

## 2018-09-23 MED ORDER — ONDANSETRON HCL 4 MG/2ML IJ SOLN: 4.0000 mg | Freq: Once | INTRAMUSCULAR | Status: DC | PRN

## 2018-09-23 MED ORDER — OXYCODONE HCL 5 MG/5ML PO SOLN: 5.0000 mg | Freq: Once | ORAL | Status: DC | PRN

## 2018-09-23 MED ORDER — SODIUM CHLORIDE 0.9% FLUSH: 3.0000 mL | INTRAVENOUS | Status: DC | PRN

## 2018-09-23 MED ORDER — LACTATED RINGERS IV SOLN
INTRAVENOUS | Status: DC
  Administered 2018-09-23: 125 mL/h via INTRAVENOUS
  Administered 2018-09-23 (×2): via INTRAVENOUS

## 2018-09-23 MED ORDER — FENTANYL CITRATE (PF) 100 MCG/2ML IJ SOLN: 25.0000 ug | INTRAMUSCULAR | Status: DC | PRN

## 2018-09-23 SURGICAL SUPPLY — 41 items
APL SKNCLS STERI-STRIP NONHPOA (GAUZE/BANDAGES/DRESSINGS) ×1
BENZOIN TINCTURE PRP APPL 2/3 (GAUZE/BANDAGES/DRESSINGS) ×2 IMPLANT
CHLORAPREP W/TINT 26ML (MISCELLANEOUS) ×3 IMPLANT
CLAMP CORD UMBIL (MISCELLANEOUS) IMPLANT
CLOSURE STERI-STRIP 1/2X4 (GAUZE/BANDAGES/DRESSINGS) ×1
CLOSURE WOUND 1/2 X4 (GAUZE/BANDAGES/DRESSINGS)
CLOTH BEACON ORANGE TIMEOUT ST (SAFETY) ×3 IMPLANT
CLSR STERI-STRIP ANTIMIC 1/2X4 (GAUZE/BANDAGES/DRESSINGS) ×1 IMPLANT
DRSG OPSITE POSTOP 4X10 (GAUZE/BANDAGES/DRESSINGS) ×3 IMPLANT
ELECT REM PT RETURN 9FT ADLT (ELECTROSURGICAL) ×3
ELECTRODE REM PT RTRN 9FT ADLT (ELECTROSURGICAL) ×1 IMPLANT
EXTRACTOR VACUUM KIWI (MISCELLANEOUS) IMPLANT
GLOVE BIO SURGEON STRL SZ 6.5 (GLOVE) ×2 IMPLANT
GLOVE BIO SURGEONS STRL SZ 6.5 (GLOVE) ×1
GLOVE BIOGEL PI IND STRL 7.0 (GLOVE) ×1 IMPLANT
GLOVE BIOGEL PI INDICATOR 7.0 (GLOVE) ×2
GOWN STRL REUS W/TWL LRG LVL3 (GOWN DISPOSABLE) ×6 IMPLANT
KIT ABG SYR 3ML LUER SLIP (SYRINGE) IMPLANT
NDL HYPO 25X5/8 SAFETYGLIDE (NEEDLE) IMPLANT
NEEDLE HYPO 25X5/8 SAFETYGLIDE (NEEDLE) IMPLANT
NS IRRIG 1000ML POUR BTL (IV SOLUTION) ×3 IMPLANT
PACK C SECTION WH (CUSTOM PROCEDURE TRAY) ×3 IMPLANT
PAD OB MATERNITY 4.3X12.25 (PERSONAL CARE ITEMS) ×3 IMPLANT
PENCIL SMOKE EVAC W/HOLSTER (ELECTROSURGICAL) ×3 IMPLANT
RETRACTOR TRAXI PANNICULUS (MISCELLANEOUS) IMPLANT
RTRCTR C-SECT PINK 25CM LRG (MISCELLANEOUS) ×3 IMPLANT
SPONGE LAP 18X18 RF (DISPOSABLE) ×9 IMPLANT
STRIP CLOSURE SKIN 1/2X4 (GAUZE/BANDAGES/DRESSINGS) IMPLANT
SUT CHROMIC 1 CTX 36 (SUTURE) ×6 IMPLANT
SUT PLAIN 0 NONE (SUTURE) IMPLANT
SUT PLAIN 2 0 XLH (SUTURE) ×3 IMPLANT
SUT VIC AB 0 CT1 27 (SUTURE) ×6
SUT VIC AB 0 CT1 27XBRD ANBCTR (SUTURE) ×2 IMPLANT
SUT VIC AB 2-0 CT1 27 (SUTURE) ×3
SUT VIC AB 2-0 CT1 TAPERPNT 27 (SUTURE) ×1 IMPLANT
SUT VIC AB 3-0 CT1 27 (SUTURE)
SUT VIC AB 3-0 CT1 TAPERPNT 27 (SUTURE) IMPLANT
SUT VIC AB 4-0 KS 27 (SUTURE) ×3 IMPLANT
TOWEL OR 17X24 6PK STRL BLUE (TOWEL DISPOSABLE) ×3 IMPLANT
TRAXI PANNICULUS RETRACTOR (MISCELLANEOUS) ×2
TRAY FOLEY W/BAG SLVR 14FR LF (SET/KITS/TRAYS/PACK) ×3 IMPLANT

## 2018-09-23 NOTE — Progress Notes (Signed)
Patient ID: Olivia Silva, female   DOB: Apr 24, 1991, 27 y.o.   MRN: [id number] Per pt no changes in dictated H&P.  Brief exam WNL.  FBS 97 this AM Ready to proceed

## 2018-09-23 NOTE — Op Note (Signed)
Operative Note    Preoperative Diagnosis Term pregnancy at 39 0/7 weeks Prior c-section, declines TOL Gestational Diabetes  Postoperative Diagnosis same  Procedure Repeat low transverse c-section with 2 layer closure of uterus  Surgeon Huel CoteKathy Doaa Kendzierski, MD Webb SilversmithHeather Krietemeyer, RNFA  Anesthesia Spinal  Fluids: EBL 250mL UOP 150mL clear IVF 2200mL LR  Findings A viable female infant in the vertex presentation Apgars 8,9 Weight pending  Normal female pelvic anatomy  Specimen Placenta to L&D  Procedure Note Patient was taken to the operating room where spinal anesthesia was obtained and found to be adequate by Allis clamp test. She was prepped and draped in the normal sterile fashion in the dorsal supine position with a leftward tilt. An appropriate time out was performed. A Pfannenstiel skin incision was then made through a pre-existing scar with the scalpel and carried through to the underlying layer of fascia by sharp dissection and Bovie cautery. The fascia was nicked in the midline and the incision was extended laterally with Mayo scissors. The inferior aspect of the incision was grasped Coker clamps and dissected off the underlying rectus muscles. In a similar fashion the superior aspect was dissected off the rectus muscles. Rectus muscles were separated in the midline and the peritoneal cavity entered bluntly. The peritoneal incision was then extended both superiorly and inferiorly with careful attention to avoid both bowel and bladder. The Alexis self-retaining wound retractor was then placed within the incision and the lower uterine segment exposed. The bladder flap was developed with Metzenbaum scissors and pushed away from the lower uterine segment. The lower uterine segment was then incised in a transverse fashion and the cavity itself entered bluntly. The incision was extended bluntly. The infant's head was then lifted and delivered from the incision without difficulty. The  remainder of the infant delivered and the nose and mouth bulb suctioned with the cord clamped and cut as well. The infant was handed off to the waiting pediatricians. The placenta was then spontaneously expressed from the uterus and the uterus cleared of all clots and debris with moist lap sponge. The uterine incision was then repaired in 2 layers the first layer was a running locked layer of 1-0 chromic and the second an imbricating layer of the same suture. The tubes and ovaries were inspected and the gutters cleared of all clots and debris. The uterine incision was inspected and found to be hemostatic. All instruments and sponges as well as the Alexis retractor were then removed from the abdomen. The rectus muscles and peritoneum were then reapproximated with a running suture of 2-0 Vicryl. The fascia was then closed with 0 Vicryl in a running fashion. Subcutaneous tissue was reapproximated with 3-0 plain in a running fashion. The skin was closed with a subcuticular stitch of 4-0 Vicryl on a Keith needle and then reinforced with benzoin and Steri-Strips. At the conclusion of the procedure all instruments and sponge counts were correct. Patient was taken to the recovery room in good condition with her baby accompanying her skin to skin.

## 2018-09-23 NOTE — Transfer of Care (Signed)
Immediate Anesthesia Transfer of Care Note  Patient: Olivia Silva  Procedure(s) Performed: REPEAT CESAREAN SECTION (N/A )  Patient Location: PACU  Anesthesia Type:Spinal  Level of Consciousness: awake, alert  and oriented  Airway & Oxygen Therapy: Patient Spontanous Breathing  Post-op Assessment: Report given to RN and Post -op Vital signs reviewed and stable  Post vital signs: Reviewed and stable  Last Vitals:  Vitals Value Taken Time  BP 137/93 09/23/2018  8:47 AM  Temp    Pulse 72 09/23/2018  8:50 AM  Resp 19 09/23/2018  8:50 AM  SpO2 99 % 09/23/2018  8:50 AM  Vitals shown include unvalidated device data.  Last Pain:  Vitals:   09/23/18 0611  TempSrc: Oral         Complications: No apparent anesthesia complications

## 2018-09-23 NOTE — Lactation Note (Signed)
This note was copied from a baby's chart. Lactation Consultation Note  Patient Name: Olivia Silva ZOXWR'UToday's Date: 09/23/2018 Reason for consult: Initial assessment;Term;Maternal endocrine disorder Type of Endocrine Disorder?: Diabetes  Visited with P2 Mom of term baby at 186 hrs old.  Mom states baby latched in PACU and breastfed for 35 mins.  Subsequent attempt, baby fed for 5 mins before falling asleep.   Baby sleeping swaddled in his crib.   Encouraged STS and watch for feeding cues.  Goal of 8-12 feedings per 24 hrs.  Lactation brochure given to Mom.   Mom informed of IP and OP lactation support available to her.   Encouraged to call prn.  Interventions Interventions: Breast feeding basics reviewed;Skin to skin;Breast massage;Hand express  Lactation Tools Discussed/Used WIC Program: Yes   Consult Status Consult Status: Follow-up Date: 09/24/18 Follow-up type: In-patient    Judee ClaraSmith, Virat Prather E 09/23/2018, 2:24 PM

## 2018-09-23 NOTE — Progress Notes (Signed)
Subjective: Postpartum Day 0: Cesarean Delivery Patient reports incisional pain and tolerating PO.    Objective: Vital signs in last 24 hours: Temp:  [97.2 F (36.2 C)-98.7 F (37.1 C)] 98.4 F (36.9 C) (11/19 1100) Pulse Rate:  [66-114] 73 (11/19 1100) Resp:  [15-24] 16 (11/19 1100) BP: (122-147)/(82-101) 134/82 (11/19 1100) SpO2:  [99 %-100 %] 100 % (11/19 1200) Weight:  [106.4 kg] 106.4 kg (11/19 16100611)  Physical Exam:  General: alert and no distress Lochia: appropriate Uterine Fundus: firm Incision: healing well DVT Evaluation: No evidence of DVT seen on physical exam.  Recent Labs    09/22/18 0925  HGB 11.5*  HCT 35.7*    Assessment/Plan: Status post Cesarean section. Doing well postoperatively.  Continue current care.  Doree Kuehne Bovard-Stuckert 09/23/2018, 4:46 PM

## 2018-09-23 NOTE — Anesthesia Postprocedure Evaluation (Signed)
Anesthesia Post Note  Patient: Olivia Silva  Procedure(s) Performed: REPEAT CESAREAN SECTION (N/A )     Patient location during evaluation: PACU Anesthesia Type: Spinal Level of consciousness: oriented and awake and alert Pain management: pain level controlled Vital Signs Assessment: post-procedure vital signs reviewed and stable Respiratory status: spontaneous breathing, respiratory function stable and patient connected to nasal cannula oxygen Cardiovascular status: blood pressure returned to baseline and stable Postop Assessment: no headache, no backache and no apparent nausea or vomiting Anesthetic complications: no    Last Vitals:  Vitals:   09/23/18 1100 09/23/18 1200  BP: 134/82   Pulse: 73   Resp: 16   Temp: 36.9 C   SpO2: 99% 100%    Last Pain:  Vitals:   09/23/18 1100  TempSrc: Oral  PainSc: 4    Pain Goal: Patients Stated Pain Goal: 2 (09/23/18 1100)               Lavergne Hiltunen

## 2018-09-23 NOTE — Anesthesia Procedure Notes (Signed)
Spinal  Patient location during procedure: OR Start time: 09/23/2018 7:28 AM End time: 09/23/2018 7:32 AM Staffing Anesthesiologist: Bethena Midgetddono, Lorella Gomez, MD Preanesthetic Checklist Completed: patient identified, site marked, surgical consent, pre-op evaluation, timeout performed, IV checked, risks and benefits discussed and monitors and equipment checked Spinal Block Patient position: sitting Prep: DuraPrep Patient monitoring: heart rate, cardiac monitor, continuous pulse ox and blood pressure Approach: midline Location: L3-4 Injection technique: single-shot Needle Needle type: Sprotte  Needle gauge: 24 G Needle length: 9 cm Assessment Sensory level: T4

## 2018-09-24 LAB — CBC
HCT: 28.9 % — ABNORMAL LOW (ref 36.0–46.0)
HEMOGLOBIN: 9.6 g/dL — AB (ref 12.0–15.0)
MCH: 29.7 pg (ref 26.0–34.0)
MCHC: 33.2 g/dL (ref 30.0–36.0)
MCV: 89.5 fL (ref 80.0–100.0)
Platelets: 159 10*3/uL (ref 150–400)
RBC: 3.23 MIL/uL — AB (ref 3.87–5.11)
RDW: 13.9 % (ref 11.5–15.5)
WBC: 10.6 10*3/uL — ABNORMAL HIGH (ref 4.0–10.5)
nRBC: 0 % (ref 0.0–0.2)

## 2018-09-24 LAB — BIRTH TISSUE RECOVERY COLLECTION (PLACENTA DONATION)

## 2018-09-24 NOTE — Progress Notes (Signed)
MOB was referred for history of depression/anxiety. * Referral screened out by Clinical Social Worker because none of the following criteria appear to apply: ~ History of anxiety/depression during this pregnancy, or of post-partum depression following prior delivery. ~ Diagnosis of anxiety and/or depression within last 3 years; no concerns noted in OB records. OR * MOB's symptoms currently being treated with medication and/or therapy.  MOB has been off meds  "for years." MOB currently receive outpatient services with [facility].   Please contact the Clinical Social Worker if needs arise, by MOB request, or if MOB scores greater than 9/yes to question 10 on Edinburgh Postpartum Depression Screen.  Arch Methot Boyd-Gilyard, MSW, LCSW Clinical Social Work [phone] 

## 2018-09-24 NOTE — Lactation Note (Addendum)
This note was copied from a baby's chart. Lactation Consultation Note  Patient Name: Olivia Silva UJWJX'BToday's Date: 09/24/2018 Reason for consult: Follow-up assessment;Term;Maternal endocrine disorder Type of Endocrine Disorder?: Diabetes   Follow up with mom of 26 hour old infant. Infant with 2 BF for 30 minutes, 1 BF attempt, 1 void, 3 stools and 1 emesis since birth. Infant weight 9 pounds 5.6 ounces with 2% weight loss since birth. Infant asleep in his crib. Mom reports infant has not fed since around 9 pm last night. Mom reports she did STS around 6 am and infant was not interested in feeding.   Infant awakened and placed STS with mom. He was noted to root some. He would latch and take a few suckles and fall back asleep. Hand expressed mom and spoon fed infant 1 ml. He was them more willing to latch and feed. Infant needed stimulation to maintain suckling and did best when breast massage/compression was performed. Infant was still feeding when LC left the room.    Enc mom to awaken infant to feed STS 8-12 x in 24 hours and with feeding cues. Discussed with mom that since infant is not feeding well and is now over 24 hours that I would recommend that she begin pumping and hand expressing and spoon feeding all milk to the infant. RN came in and infant skin bilirubin level in climbing, infant to have stat bilirubin.   LC left room while mom and infant being checked to return to set up DEBP for mom to begin pumping. Mom voiced understanding. Mom to call out for assistance as needed.    Maternal Data Formula Feeding for Exclusion: No Has patient been taught Hand Expression?: Yes Does the patient have breastfeeding experience prior to this delivery?: Yes  Feeding Feeding Type: Breast Fed  LATCH Score Latch: Repeated attempts needed to sustain latch, nipple held in mouth throughout feeding, stimulation needed to elicit sucking reflex.  Audible Swallowing: A few with stimulation  Type  of Nipple: Everted at rest and after stimulation  Comfort (Breast/Nipple): Soft / non-tender  Hold (Positioning): Assistance needed to correctly position infant at breast and maintain latch.  LATCH Score: 7  Interventions Interventions: Breast feeding basics reviewed;Support pillows;Assisted with latch;Skin to skin;Position options;Breast massage;Breast compression;Hand express  Lactation Tools Discussed/Used WIC Program: Yes   Consult Status Consult Status: Follow-up Date: 09/24/18 Follow-up type: In-patient    Silas FloodSharon S Hice 09/24/2018, 10:52 AM

## 2018-09-24 NOTE — Discharge Summary (Addendum)
OB Discharge Summary     Patient Name: Olivia Silva DOB: 10/18/1991 MRN: [id number]  Date of admission: 09/23/2018 Delivering MD: Huel CoteICHARDSON, KATHY   Date of discharge: 09/26/2018  Admitting diagnosis: repeat C-Section Intrauterine pregnancy: [id number]     Secondary diagnosis:  Active Problems:   S/P repeat low transverse C-section  Additional problems: Gestational Diabetes     Discharge diagnosis: Term Pregnancy Delivered and GDM A2                                                                                                Post partum procedures:none   Complications: None  Hospital course:  Sceduled C/S   27 y.o. yo G2P2002 at 1274w0d was admitted to the hospital 09/23/2018 for scheduled cesarean section with the following indication:Elective Repeat.  Membrane Rupture Time/Date: 7:57 AM ,09/23/2018   Patient delivered a Viable infant.09/23/2018  Details of operation can be found in separate operative note.  Pateint had an uncomplicated postpartum course.  She is ambulating, tolerating a regular diet, passing flatus, and urinating well. Patient is discharged home in stable condition on  09/25/18         Physical exam  Vitals:   09/24/18 1344 09/24/18 1414 09/24/18 2233 09/25/18 0521  BP: (!) 140/94 119/71 110/61 125/71  Pulse: 66 60 76 71  Resp: 19   16  Temp: 99.6 F (37.6 C)  98.4 F (36.9 C) 97.7 F (36.5 C)  TempSrc: Oral  Oral Oral  SpO2: 99%  99% 100%  Weight:      Height:       General: alert and cooperative Lochia: appropriate Uterine Fundus: firm Incision: Dressing is clean, dry, and intact  Labs: Lab Results  Component Value Date   WBC 10.6 (H) 09/24/2018   HGB 9.6 (L) 09/24/2018   HCT 28.9 (L) 09/24/2018   MCV 89.5 09/24/2018   PLT 159 09/24/2018   CMP Latest Ref Rng & Units 09/22/2018  Glucose 70 - 99 mg/dL 91  BUN 6 - 20 mg/dL 11  Creatinine 4.010.44 - 0.271.00 mg/dL 2.530.71  Sodium 664135 - 403145 mmol/L 134(L)  Potassium 3.5 - 5.1 mmol/L 3.9   Chloride 98 - 111 mmol/L 104  CO2 22 - 32 mmol/L 19(L)  Calcium 8.9 - 10.3 mg/dL 4.7(Q8.7(L)  Total Protein 6.0 - 8.3 g/dL -  Total Bilirubin 0.3 - 1.2 mg/dL -  Alkaline Phos 47 - 259119 U/L -  AST 0 - 37 U/L -  ALT 0 - 35 U/L -    Discharge instruction: per After Visit Summary and "Baby and Me Booklet".  After visit meds:  Allergies as of 09/25/2018      Reactions   Sulfa Antibiotics Hives      Medication List    STOP taking these medications   metFORMIN 500 MG tablet Commonly known as:  GLUCOPHAGE     TAKE these medications   acetaminophen 325 MG tablet Commonly known as:  TYLENOL Take 2 tablets (650 mg total) by mouth every 4 (four) hours as needed (for pain scale < 4).   ibuprofen 600 MG tablet  Commonly known as:  ADVIL,MOTRIN Take 1 tablet (600 mg total) by mouth every 6 (six) hours.   oxyCODONE 5 MG immediate release tablet Commonly known as:  Oxy IR/ROXICODONE Take 1 tablet (5 mg total) by mouth every 4 (four) hours as needed (pain scale 4-7).   prenatal multivitamin Tabs tablet Take 1 tablet by mouth daily.       Diet: routine diet  Activity: Advance as tolerated. Pelvic rest for 6 weeks.   Outpatient follow up:2 weeks Follow up Appt:No future appointments. Follow up Visit:No follow-ups on file.  Postpartum contraception: Undecided  Newborn Data: Live born female  Birth Weight: 9 lb 8.9 oz (4335 g) APGAR: 8, 9  Newborn Delivery   Birth date/time:  09/23/2018 07:58:00 Delivery type:  C-Section, Low Transverse Trial of labor:  No C-section categorization:  Repeat     Baby Feeding: Breast Disposition:home with mother

## 2018-09-24 NOTE — Lactation Note (Signed)
This note was copied from a baby's chart. Lactation Consultation Note  Patient Name: Olivia Silva XBJYN'WToday's Date: 09/24/2018 Reason for consult: Follow-up assessment;Difficult latch;Term;Maternal endocrine disorder Type of Endocrine Disorder?: Diabetes   \DEBP set up with instructions for use on Initiate setting, assembling, disassembling and cleaning of pump parts. Enc mom to pump post BF every 2-3 hours and follow with hand expression. Advised family to offer infant all EBM that is pumped/expressed with spoon. Mom was shown how to hand express earlier.   Infant was awake after lab draw. Assisted mom in latching infant to the left breast in the football hold. Infant more active at the breast. Infant still feeding when LC left the room.   Plan:   Feed infant at the breast with feeding cues with no longer than 3 hours between feeds Offer infant any EBM that is available by spoon Pump for 15 minutes with DEBP on Initiate setting Hand express on both breasts  Call out for assistance as needed.    Maternal Data Formula Feeding for Exclusion: No Has patient been taught Hand Expression?: Yes Does the patient have breastfeeding experience prior to this delivery?: Yes  Feeding Feeding Type: Breast Fed  LATCH Score Latch: Grasps breast easily, tongue down, lips flanged, rhythmical sucking.  Audible Swallowing: A few with stimulation  Type of Nipple: Everted at rest and after stimulation  Comfort (Breast/Nipple): Soft / non-tender  Hold (Positioning): Assistance needed to correctly position infant at breast and maintain latch.  LATCH Score: 8  Interventions Interventions: Breast feeding basics reviewed;Support pillows;Assisted with latch;Skin to skin;Position options;Breast massage;Breast compression;Hand express  Lactation Tools Discussed/Used WIC Program: Yes Pump Review: Setup, frequency, and cleaning;Milk Storage Initiated by:: Noralee StainSharon Hice, RN, IBCLC Date initiated::  09/24/18   Consult Status Consult Status: Follow-up Date: 09/25/18 Follow-up type: In-patient    Silas FloodSharon S Hice 09/24/2018, 11:33 AM

## 2018-09-24 NOTE — Progress Notes (Signed)
Subjective: Postpartum Day 1: Cesarean Delivery Patient reports tolerating PO, + flatus and no problems voiding.  Pain well controlled per FOB. No complaints. Sleeping soundly. Bonding well with baby  Objective: Vital signs in last 24 hours: Temp:  [97.2 F (36.2 C)-98.5 F (36.9 C)] 98.5 F (36.9 C) (11/20 0550) Pulse Rate:  [58-73] 73 (11/20 0550) Resp:  [16-18] 18 (11/20 0550) BP: (122-134)/(71-90) 132/89 (11/20 0550) SpO2:  [98 %-100 %] 99 % (11/20 0550)  Physical Exam:  General: no distress Lochia: appropriate Uterine Fundus: firm Incision: no drainage DVT Evaluation: scds in place  Recent Labs    09/22/18 0925 09/24/18 0532  HGB 11.5* 9.6*  HCT 35.7* 28.9*    Assessment/Plan: Status post Cesarean section. Doing well postoperatively.  Continue current care.  Cathrine MusterCecilia W Danyele Smejkal 09/24/2018, 9:40 AM

## 2018-09-25 MED ORDER — OXYCODONE HCL 5 MG PO TABS: 5.0000 mg | ORAL_TABLET | ORAL | 0 refills | Status: AC | PRN

## 2018-09-25 MED ORDER — ACETAMINOPHEN 325 MG PO TABS: 650.0000 mg | ORAL_TABLET | ORAL | 0 refills | Status: AC | PRN

## 2018-09-25 MED ORDER — IBUPROFEN 600 MG PO TABS: 600.0000 mg | ORAL_TABLET | Freq: Four times a day (QID) | ORAL | 0 refills | Status: DC

## 2018-09-25 NOTE — Progress Notes (Signed)
Subjective: Postpartum Day 2: Cesarean Delivery Patient reports incisional pain and tolerating PO.  Pt ambulating and voiding fine, had BM.  Has not taken any oxycodone and sometimes pain not optimally controlled  Objective: Vital signs in last 24 hours: Temp:  [97.7 F (36.5 C)-99.6 F (37.6 C)] 97.7 F (36.5 C) (11/21 0521) Pulse Rate:  [60-76] 71 (11/21 0521) Resp:  [16-19] 16 (11/21 0521) BP: (110-140)/(61-94) 125/71 (11/21 0521) SpO2:  [99 %-100 %] 100 % (11/21 0521)  Physical Exam:  General: alert and cooperative Lochia: appropriate Uterine Fundus: firm Incision: C/D/I   Recent Labs    09/22/18 0925 09/24/18 0532  HGB 11.5* 9.6*  HCT 35.7* 28.9*    Assessment/Plan: Status post Cesarean section. Doing well postoperatively.  Discharge home with standard precautions and return to clinic in 1-2 weeks, plans circ in office D/w pt ok to use occasional oxycodone  Oliver PilaKathy W Gyneth Hubka 09/25/2018, 8:50 AM

## 2018-09-26 NOTE — Progress Notes (Signed)
POD #3 LTCS Doing well, baby on bili blanket Afeb, VSS Abd- soft, fundus firm, incision intact Continue routine care for now

## 2018-09-26 NOTE — Lactation Note (Signed)
This note was copied from a baby's chart. Lactation Consultation Note  Patient Name: Boy Windle Guardabitha Liss OZHYQ'MToday's Date: 09/26/2018 Reason for consult: Follow-up assessment;Hyperbilirubinemia Mom and RN report baby is breastfeeding well.  Baby continues to receive formula syringe feedings as supplement.  Baby is 7578 hours old.  Mom reports breast fullness but she is not pumping.  Instructed to post pump so she can supplement with expressed breast milk.  Mom has a breast pump at home.  Mom denies questions or concerns.  Encouraged to call for assist prn.  Maternal Data    Feeding    LATCH Score                   Interventions    Lactation Tools Discussed/Used     Consult Status Consult Status: Follow-up Date: 09/27/18 Follow-up type: In-patient    Huston FoleyMOULDEN, Kaleena Corrow S 09/26/2018, 2:14 PM

## 2018-10-24 ENCOUNTER — Inpatient Hospital Stay (HOSPITAL_COMMUNITY): Admit: 2018-10-24 | Payer: Medicaid Other | Admitting: Obstetrics and Gynecology

## 2020-10-20 ENCOUNTER — Emergency Department (HOSPITAL_COMMUNITY): Payer: BC Managed Care – PPO

## 2020-10-20 ENCOUNTER — Encounter (HOSPITAL_COMMUNITY): Payer: Self-pay

## 2020-10-20 ENCOUNTER — Other Ambulatory Visit: Payer: Self-pay

## 2020-10-20 ENCOUNTER — Emergency Department (HOSPITAL_COMMUNITY)
Admission: EM | Admit: 2020-10-20 | Discharge: 2020-10-21 | Disposition: A | Discharge status: Home / Self Care | Payer: BC Managed Care – PPO | Attending: Emergency Medicine | Admitting: Emergency Medicine

## 2020-10-20 DIAGNOSIS — S93422A Sprain of deltoid ligament of left ankle, initial encounter: Secondary | ICD-10-CM | POA: Diagnosis not present

## 2020-10-20 DIAGNOSIS — Z87891 Personal history of nicotine dependence: Secondary | ICD-10-CM | POA: Insufficient documentation

## 2020-10-20 DIAGNOSIS — W1839XA Other fall on same level, initial encounter: Secondary | ICD-10-CM | POA: Diagnosis not present

## 2020-10-20 DIAGNOSIS — S99912A Unspecified injury of left ankle, initial encounter: Secondary | ICD-10-CM | POA: Diagnosis present

## 2020-10-20 DIAGNOSIS — Y9301 Activity, walking, marching and hiking: Secondary | ICD-10-CM | POA: Insufficient documentation

## 2020-10-20 MED ORDER — IBUPROFEN 200 MG PO TABS
600.0000 mg | ORAL_TABLET | Freq: Once | ORAL | Status: DC
  Filled 2020-10-20: qty 3

## 2020-10-20 MED ORDER — IBUPROFEN 600 MG PO TABS: 600.0000 mg | ORAL_TABLET | Freq: Four times a day (QID) | ORAL | 0 refills | Status: AC | PRN

## 2020-10-20 NOTE — ED Provider Notes (Signed)
[facility]-EMERGENCY DEPT Provider Note   CSN: [id number] Arrival date & time: 10/20/20  2222     History Chief Complaint  Patient presents with  . Ankle Pain  . Ankle Injury    Olivia Silva is a 29 y.o. female. Olivia Silva is a 29 y.o. female who presents to the Emergency Department complaining of ankle injury. She states that she was walking down to stop holding her son when she Ms. stopped and her left ankle rolled. She fell to the ground. She experienced immediate severe pain throughout the left ankle. Pain is worse over the lateral ankle. It radiates to her knee at times. She is unable to bear weight secondary to pain. No prior similar injuries. She has no significant medical problems.  History is provided by the patient.    Past Medical History:  Diagnosis Date  . ADHD (attention deficit hyperactivity disorder)   . Anxiety   . Gestational diabetes   . Major depressive disorder   . Mental disorder   . ODD (oppositional defiant disorder)   . S/P cesarean section 02/06/2012    Patient Active Problem List   Diagnosis Date Noted  . S/P repeat low transverse C-section 09/23/2018  . Abnormal glucose tolerance test (GTT) during pregnancy, antepartum 08/26/2018    Past Surgical History:  Procedure Laterality Date  . CESAREAN SECTION  02/04/2012   Procedure: CESAREAN SECTION;  Surgeon: Oliver Pila, MD;  Location: [facility] ORS;  Service: Gynecology;  Laterality: N/A;  Primary Cesarean Section Delivery Boy @ 1818, Apgars 9/10   . CESAREAN SECTION N/A 09/23/2018   Procedure: REPEAT CESAREAN SECTION;  Surgeon: Huel Cote, MD;  Location: [facility] BIRTHING SUITES;  Service: Obstetrics;  Laterality: N/A;  Heather, RNFA  . MOUTH SURGERY       OB History    Gravida  2   Para  2   Term  2   Preterm      AB      Living  2     SAB      IAB      Ectopic      Multiple  0   Live Births  2           Family History  Problem  Relation Age of Onset  . Hypertension Mother   . Cancer Mother   . Diabetes Mother   . Hypertension Father   . Cancer Father   . Diabetes Father   . Heart attack Maternal Grandfather   . Anesthesia problems Neg Hx   . Hypotension Neg Hx   . Malignant hyperthermia Neg Hx   . Pseudochol deficiency Neg Hx     Social History   Tobacco Use  . Smoking status: Former Smoker    Types: Cigarettes    Quit date: 02/08/2018    Years since quitting: 2.6  . Smokeless tobacco: Never Used  Vaping Use  . Vaping Use: Never used  Substance Use Topics  . Alcohol use: No  . Drug use: No    Home Medications Prior to Admission medications   Medication Sig Start Date End Date Taking? Authorizing Provider  acetaminophen (TYLENOL) 325 MG tablet Take 2 tablets (650 mg total) by mouth every 4 (four) hours as needed (for pain scale < 4). 09/25/18   Huel Cote, MD  ibuprofen (ADVIL) 600 MG tablet Take 1 tablet (600 mg total) by mouth every 6 (six) hours as needed. 10/20/20   Tilden Fossa, MD  oxyCODONE (OXY IR/ROXICODONE) 5 MG immediate release tablet Take 1 tablet (5 mg total) by mouth every 4 (four) hours as needed (pain scale 4-7). 09/25/18   Huel Cote, MD  Prenatal Vit-Fe Fumarate-FA (PRENATAL MULTIVITAMIN) TABS Take 1 tablet by mouth daily. Patient not taking: Reported on 09/16/2018 02/06/12   Sherian Rein, MD    Allergies    Sulfa antibiotics  Review of Systems   Review of Systems  All other systems reviewed and are negative.   Physical Exam Updated Vital Signs BP 113/73 (BP Location: Left Arm)   Pulse 79   Temp 98.4 F (36.9 C) (Oral)   Resp 16   LMP 09/29/2020 (Approximate)   SpO2 97%   Physical Exam Vitals and nursing note reviewed.  Constitutional:      Appearance: She is well-developed and well-nourished.  HENT:     Head: Normocephalic and atraumatic.  Cardiovascular:     Rate and Rhythm: Normal rate and regular rhythm.  Pulmonary:     Effort:  Pulmonary effort is normal. No respiratory distress.  Musculoskeletal:        General: No edema.     Comments: 2+ left DP pulse. There is moderate soft tissue swelling over the left lateral ankle with tenderness to palpation over the lateral malleolus. There is decreased range of motion in the left ankle secondary to pain. There is no significant tenderness over the knee or fibular head. Flexion extension is intact at the knee.  Skin:    General: Skin is warm and dry.  Neurological:     Mental Status: She is alert and oriented to person, place, and time.  Psychiatric:        Mood and Affect: Mood and affect normal.        Behavior: Behavior normal.     ED Results / Procedures / Treatments   Labs (all labs ordered are listed, but only abnormal results are displayed) Labs Reviewed - No data to display  EKG None  Radiology DG Ankle Complete Left  Result Date: 10/20/2020 CLINICAL DATA:  Left ankle pain.  Fall EXAM: LEFT ANKLE COMPLETE - 3+ VIEW COMPARISON:  None FINDINGS: Lateral soft tissue swelling. No acute bony abnormality. Specifically, no fracture, subluxation, or dislocation. Joint spaces maintained. IMPRESSION: No acute bony abnormality. Electronically Signed   By: Charlett Nose M.D.   On: 10/20/2020 22:57   DG Knee Complete 4 Views Left  Result Date: 10/20/2020 CLINICAL DATA:  Fall EXAM: LEFT KNEE - COMPLETE 4+ VIEW COMPARISON:  None. FINDINGS: No evidence of fracture, dislocation, or joint effusion. No evidence of arthropathy or other focal bone abnormality. Soft tissues are unremarkable. IMPRESSION: Negative. Electronically Signed   By: Charlett Nose M.D.   On: 10/20/2020 22:58    Procedures Procedures (including critical care time)  Medications Ordered in ED Medications  ibuprofen (ADVIL) tablet 600 mg (has no administration in time range)    ED Course  I have reviewed the triage vital signs and the nursing notes.  Pertinent labs & imaging results that were  available during my care of the patient were reviewed by me and considered in my medical decision making (see chart for details).    MDM Rules/Calculators/A&P                         patient here for evaluation of left ankle pain following a twisting injury. Imaging is negative for acute fracture. Examination is consistent with sprain. Given significant pain  and decreased range of motion will place in a splint with crutches. Discussed orthopedics follow-up.  Final Clinical Impression(s) / ED Diagnoses Final diagnoses:  Sprain of deltoid ligament of left ankle, initial encounter    Rx / DC Orders ED Discharge Orders         Ordered    ibuprofen (ADVIL) 600 MG tablet  Every 6 hours PRN        10/20/20 2345           Tilden Fossa, MD 10/21/20 [phone]

## 2020-10-20 NOTE — ED Triage Notes (Signed)
Pt presents with c/o left ankle pain and left knee pain after a fall that occurred today.

## 2021-12-14 IMAGING — CR DG ANKLE COMPLETE 3+V*L*
3 series · 3 of 3 positions shown · non-contrast
Comparison: None

CLINICAL DATA: Left ankle pain.  Fall

EXAM:
LEFT ANKLE COMPLETE - 3+ VIEW

[x ankle ap left]
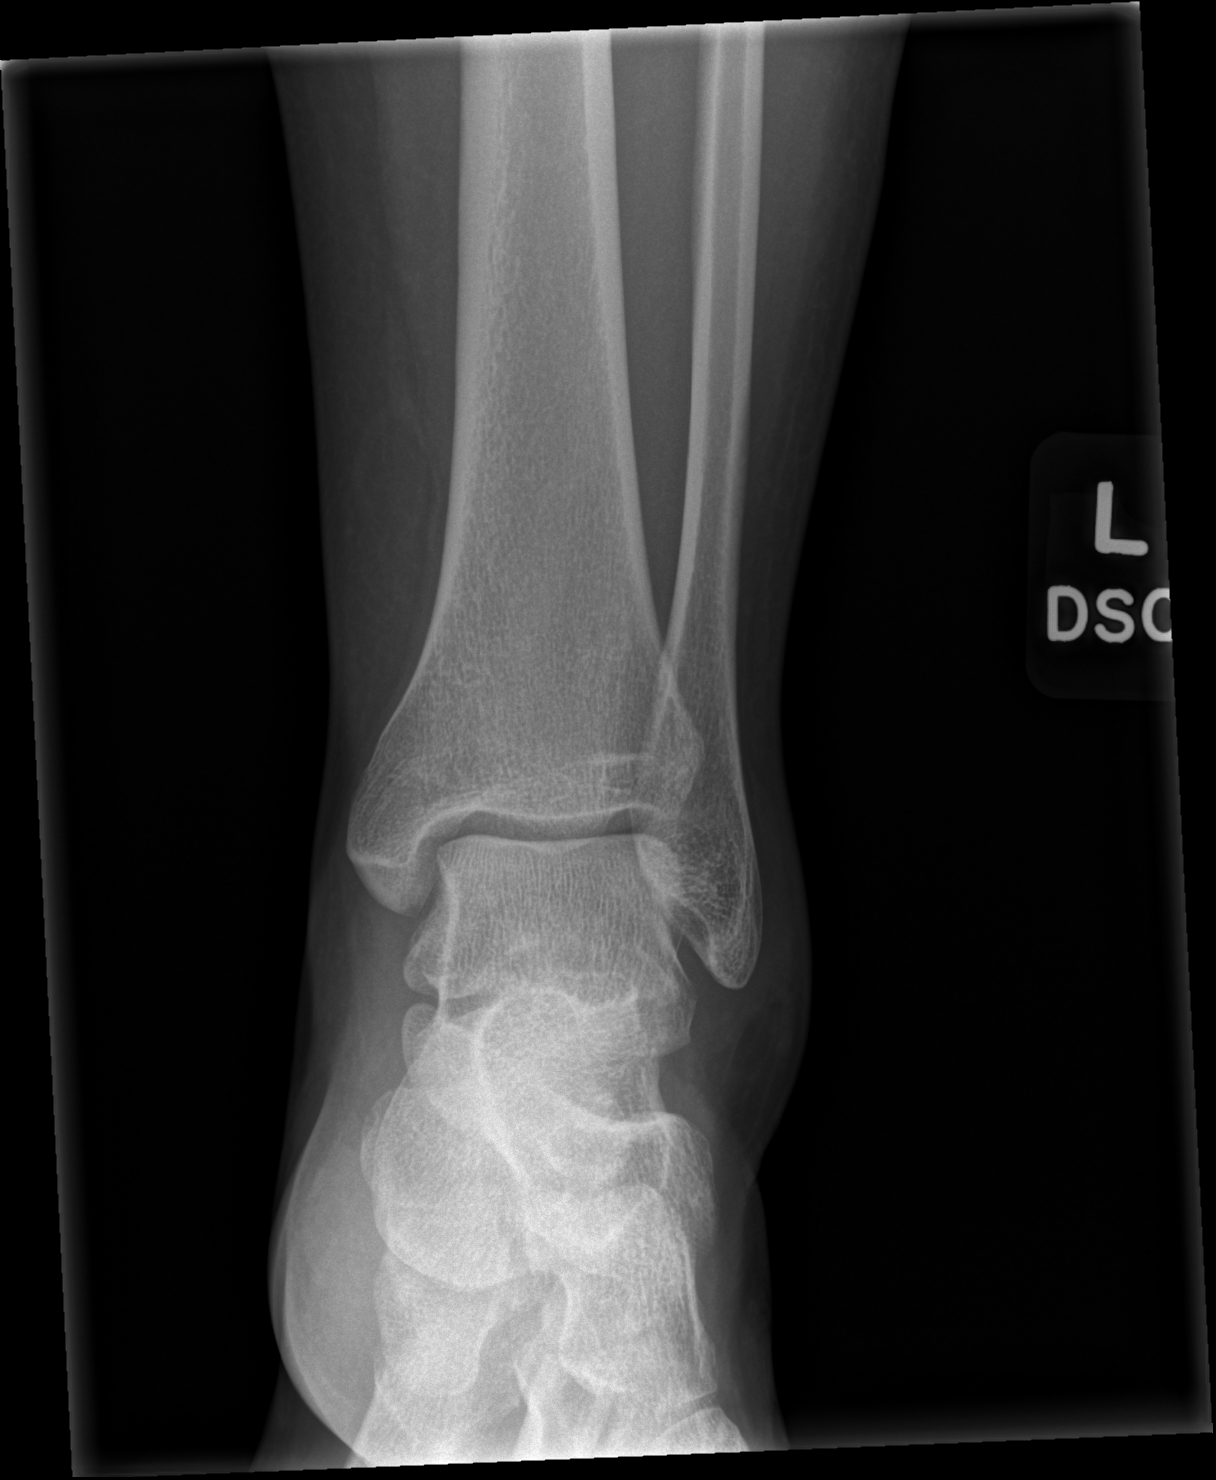

[x ankle obl left]
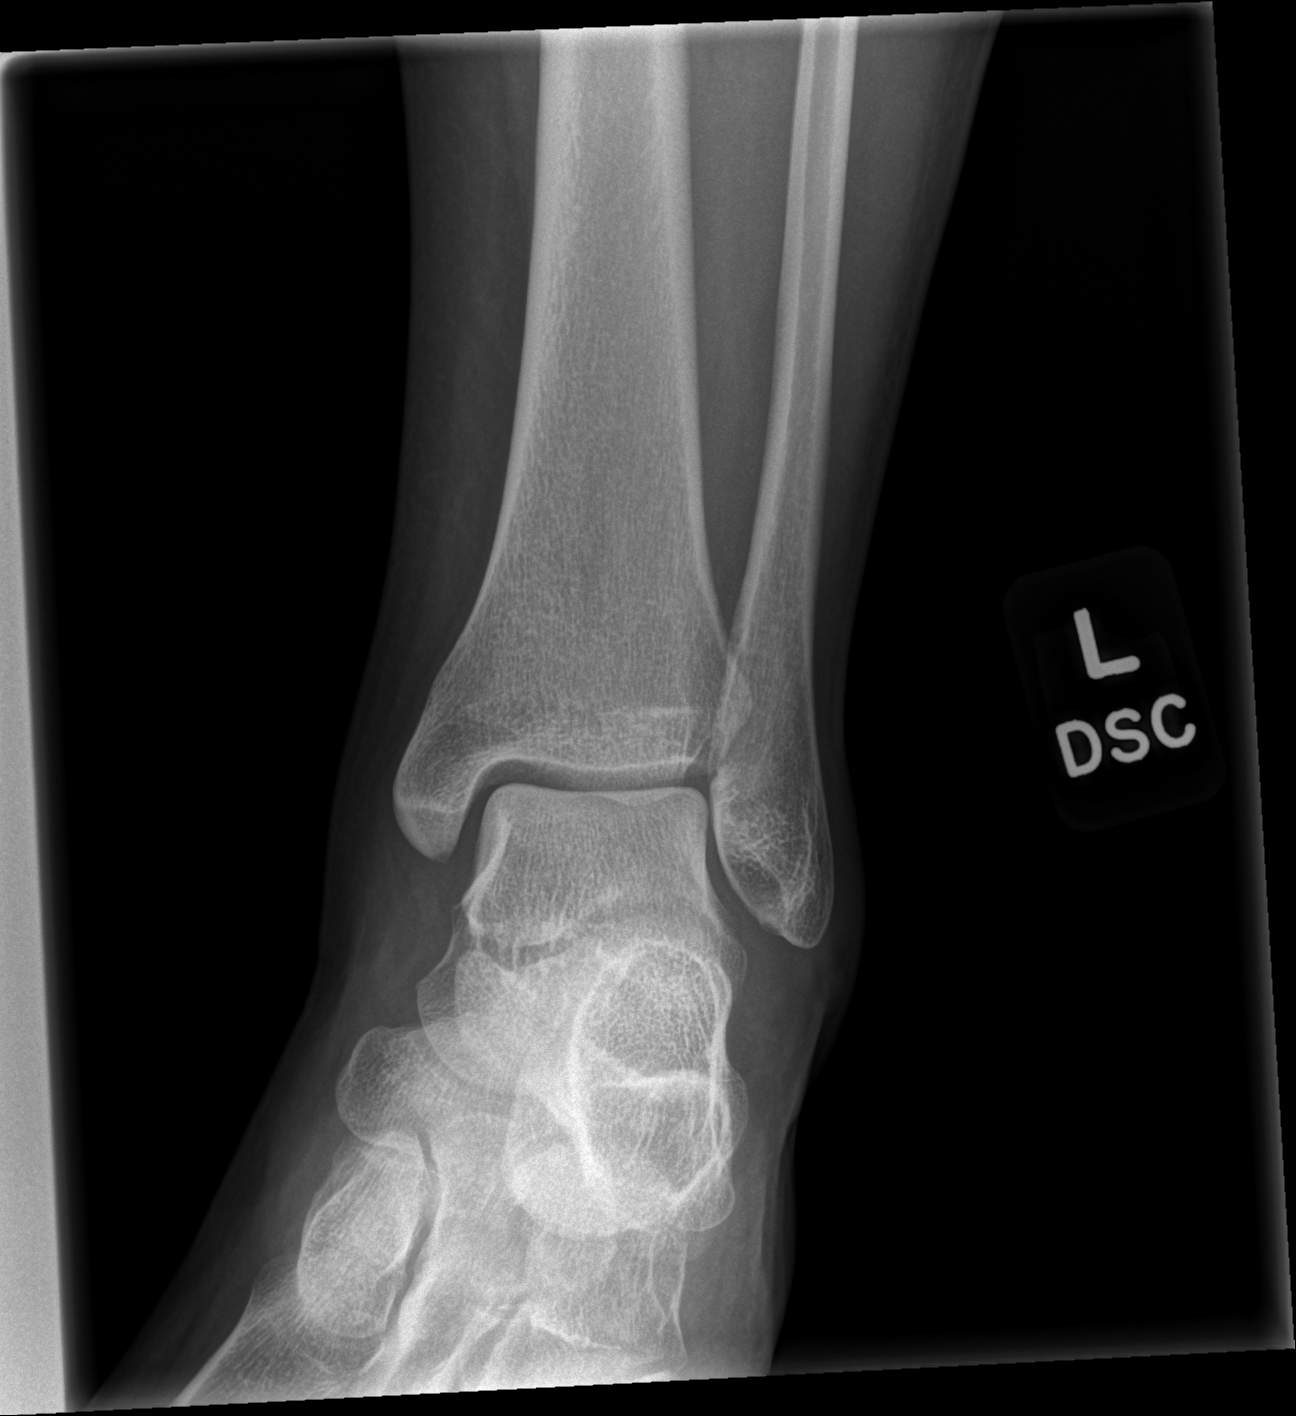

[x ankle lat left]
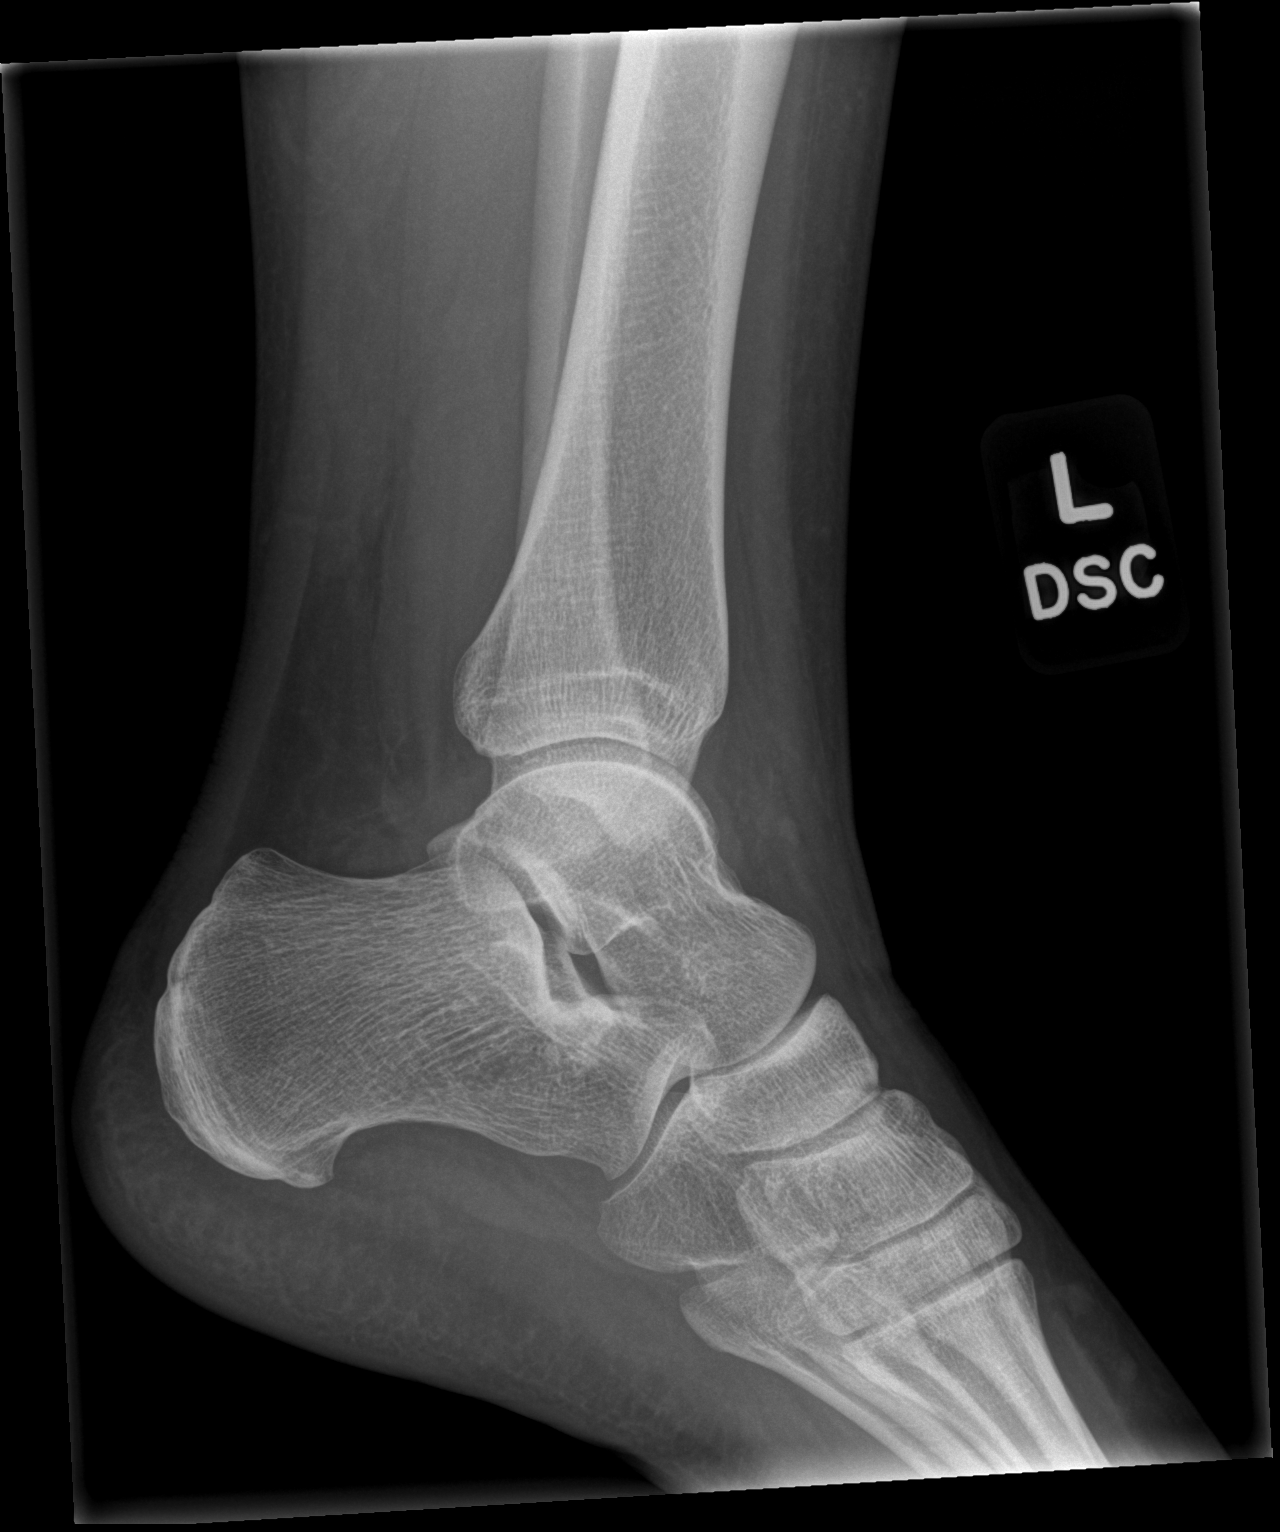

[3 of 3 positions shown; findings below may reference images not displayed]

FINDINGS: Lateral soft tissue swelling. No acute bony abnormality.
Specifically, no fracture, subluxation, or dislocation. Joint spaces
maintained.
IMPRESSION: No acute bony abnormality.

## 2021-12-14 IMAGING — CR DG KNEE COMPLETE 4+V*L*
4 series · 4 of 4 positions shown · non-contrast
Comparison: None.

CLINICAL DATA: Fall

EXAM:
LEFT KNEE - COMPLETE 4+ VIEW

[t knee ap left]
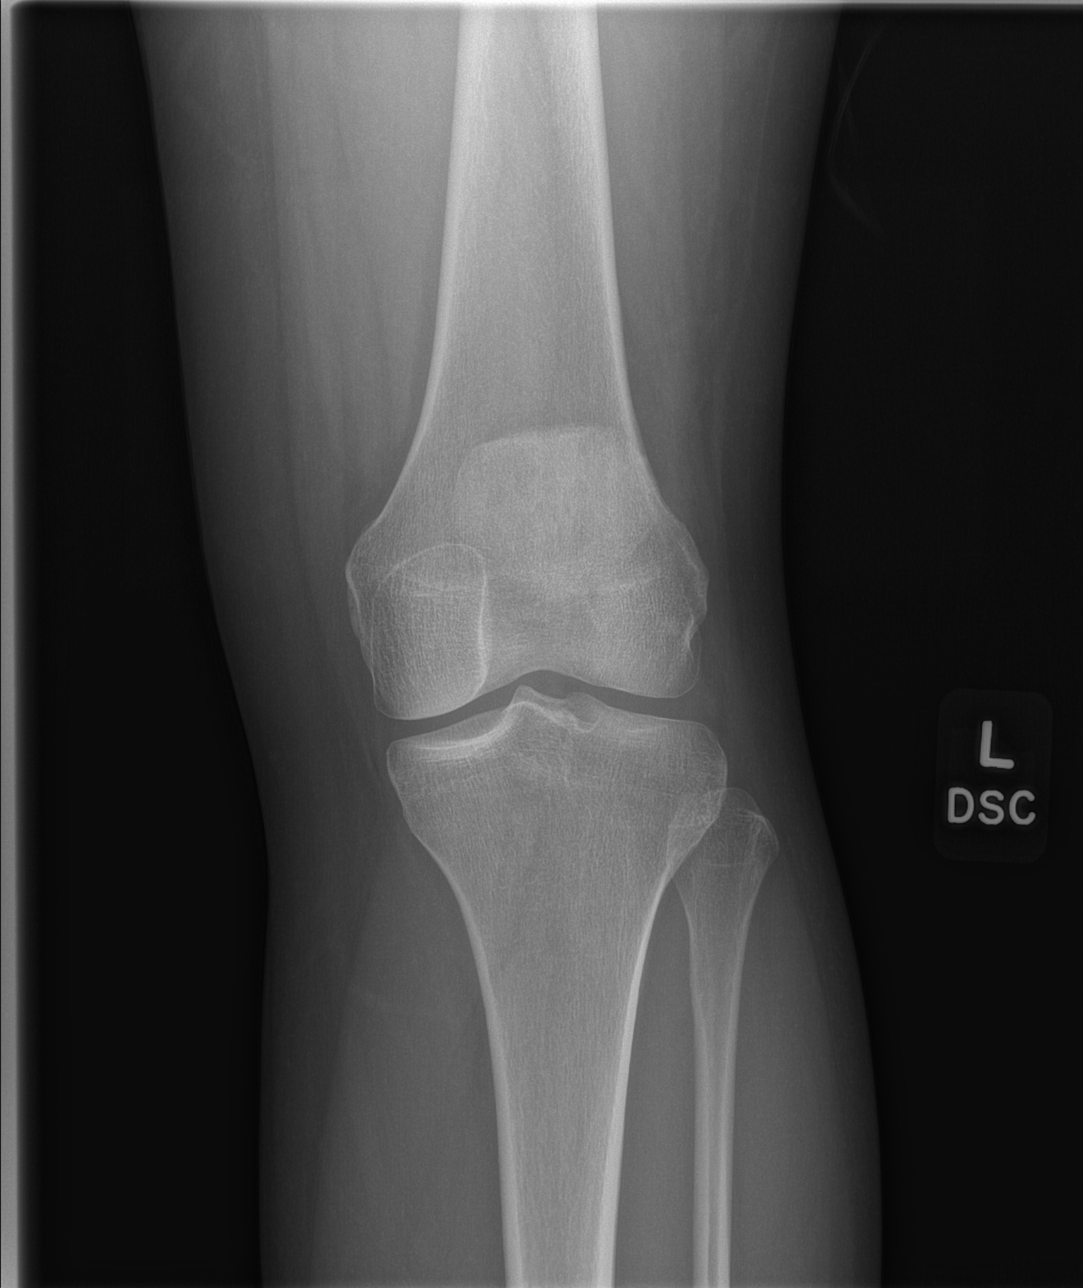

[t knee obl left (1 of 2)]
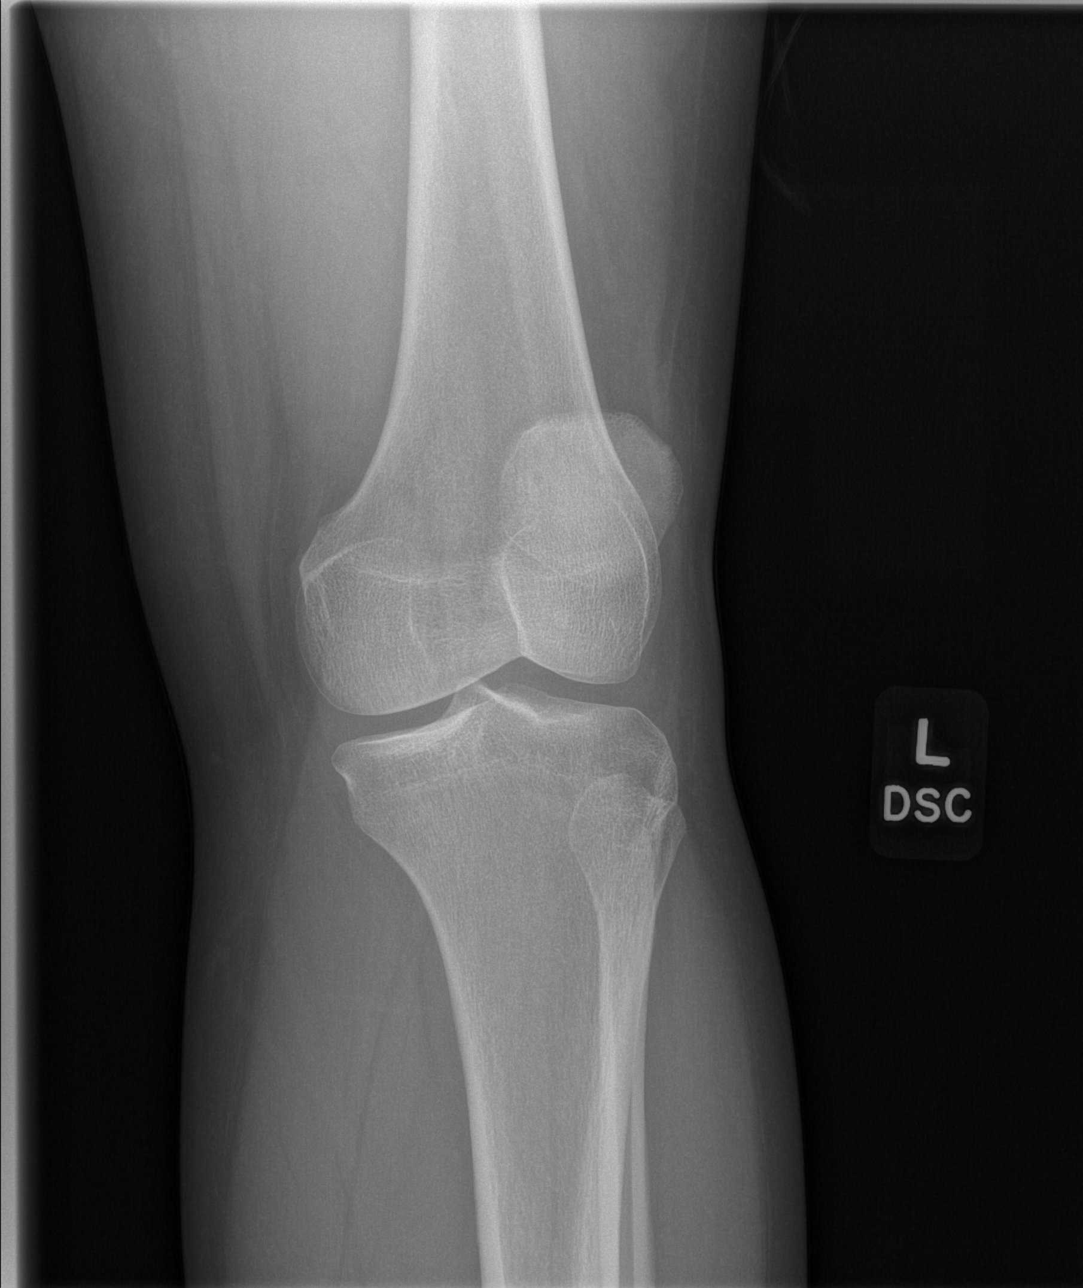

[t knee obl left (2 of 2)]
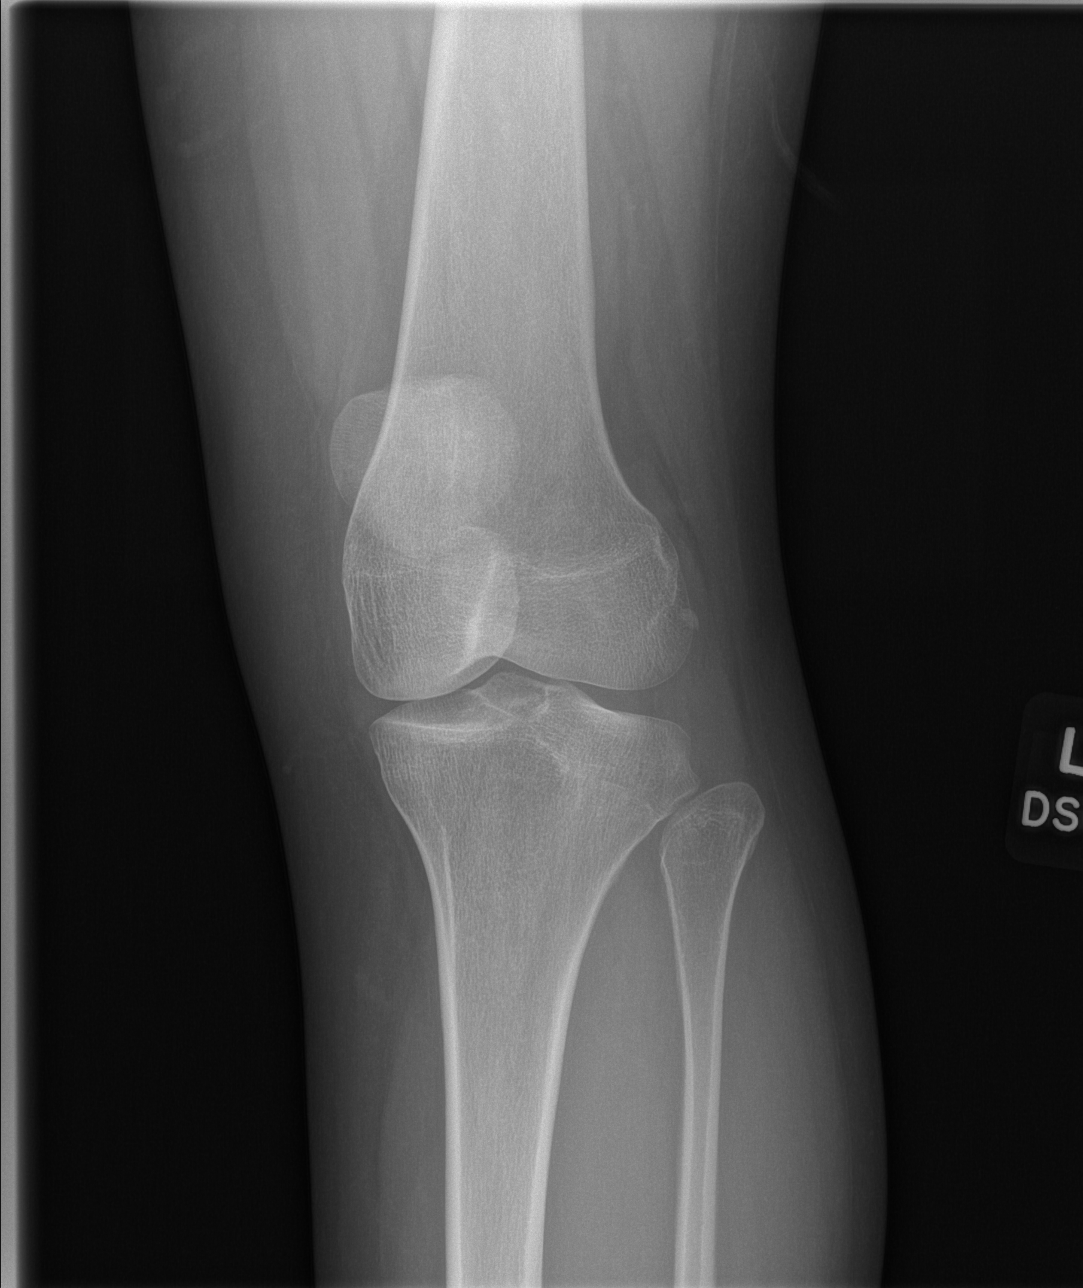

[t knee lat left]
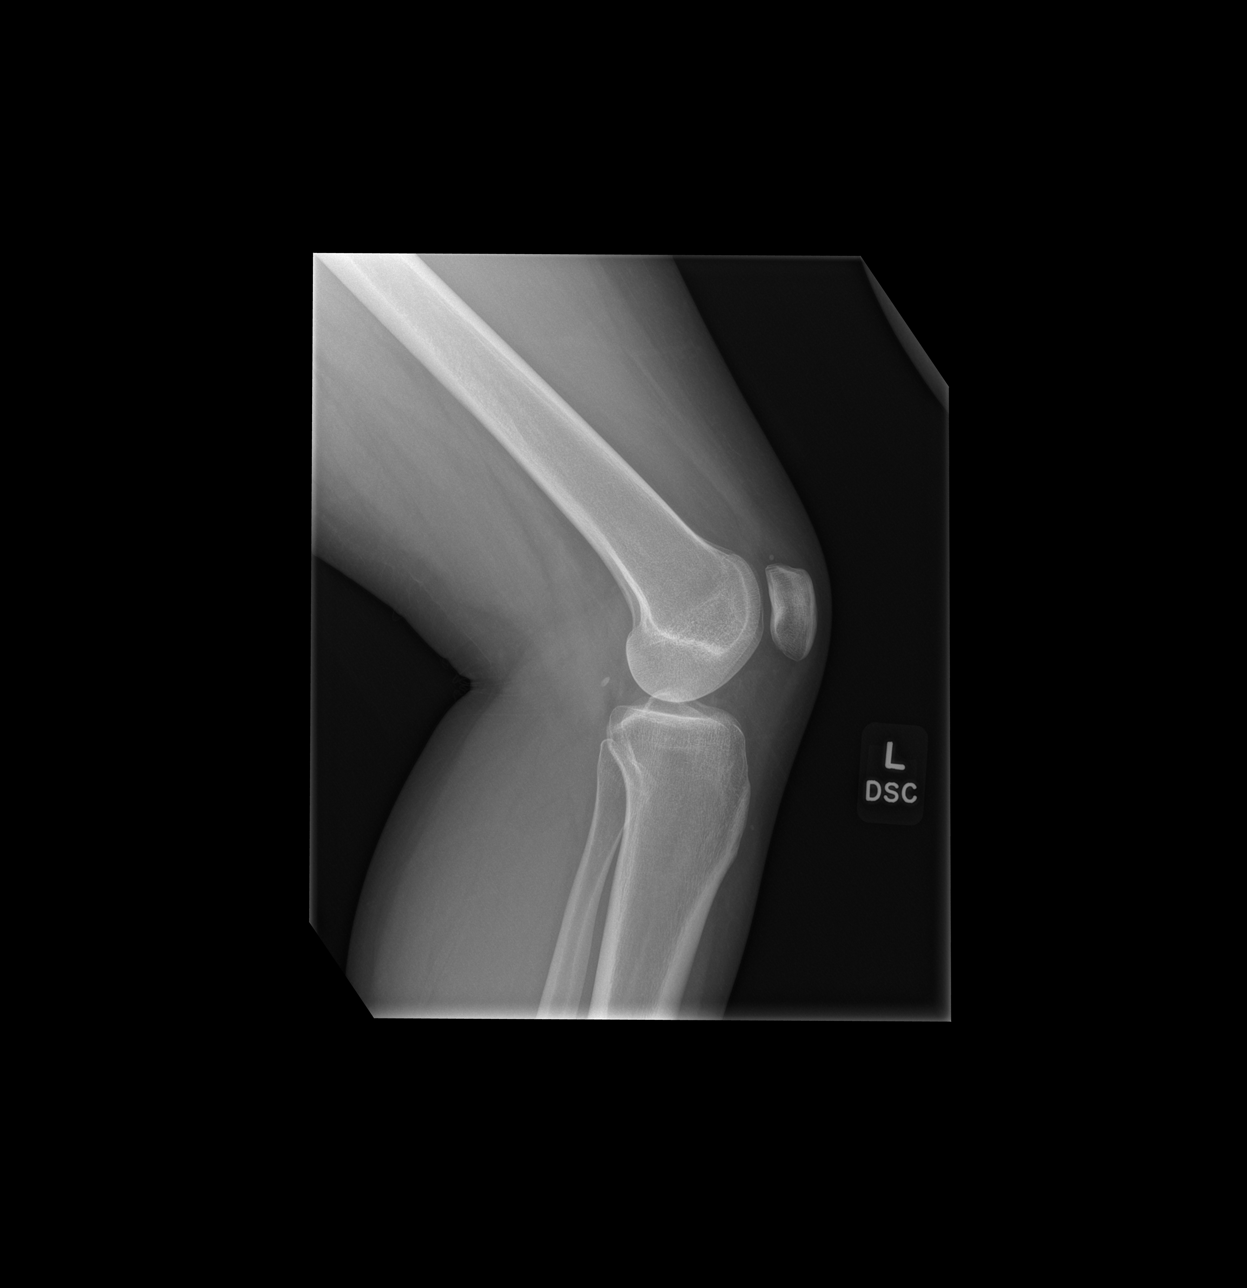

[4 of 4 positions shown; findings below may reference images not displayed]

FINDINGS: No evidence of fracture, dislocation, or joint effusion. No evidence
of arthropathy or other focal bone abnormality. Soft tissues are
unremarkable.
IMPRESSION: Negative.
